# Patient Record
Sex: Female | Born: 1980
Health system: Southern US, Community
[De-identification: ages and names within clinical notes are randomized; demographics above are authoritative.]

## PROBLEM LIST (undated history)

## (undated) ENCOUNTER — Inpatient Hospital Stay (HOSPITAL_COMMUNITY): Admission: RE | Payer: Self-pay | Source: Ambulatory Visit

## (undated) DIAGNOSIS — K824 Cholesterolosis of gallbladder: Secondary | ICD-10-CM

## (undated) DIAGNOSIS — K219 Gastro-esophageal reflux disease without esophagitis: Secondary | ICD-10-CM

## (undated) DIAGNOSIS — R634 Abnormal weight loss: Secondary | ICD-10-CM

## (undated) DIAGNOSIS — F419 Anxiety disorder, unspecified: Secondary | ICD-10-CM

## (undated) DIAGNOSIS — Z87442 Personal history of urinary calculi: Secondary | ICD-10-CM

## (undated) DIAGNOSIS — N200 Calculus of kidney: Secondary | ICD-10-CM

## (undated) HISTORY — DX: Cholesterolosis of gallbladder: K82.4

## (undated) HISTORY — DX: Anxiety disorder, unspecified: F41.9

## (undated) HISTORY — PX: NO PAST SURGERIES: SHX2092

## (undated) HISTORY — DX: Gastro-esophageal reflux disease without esophagitis: K21.9

## (undated) HISTORY — DX: Abnormal weight loss: R63.4

---

## 2005-02-22 ENCOUNTER — Ambulatory Visit: Payer: Self-pay | Admitting: Family Medicine

## 2005-03-12 ENCOUNTER — Ambulatory Visit: Payer: Self-pay | Admitting: Family Medicine

## 2008-07-08 ENCOUNTER — Encounter: Admission: RE | Admit: 2008-07-08 | Discharge: 2008-07-08 | Payer: Self-pay | Admitting: Gastroenterology

## 2008-12-19 ENCOUNTER — Encounter: Admission: RE | Admit: 2008-12-19 | Discharge: 2008-12-19 | Payer: Self-pay | Admitting: Gastroenterology

## 2009-01-13 ENCOUNTER — Ambulatory Visit (HOSPITAL_COMMUNITY): Admission: RE | Admit: 2009-01-13 | Discharge: 2009-01-13 | Payer: Self-pay | Admitting: Gastroenterology

## 2009-11-24 ENCOUNTER — Encounter (INDEPENDENT_AMBULATORY_CARE_PROVIDER_SITE_OTHER): Payer: Self-pay | Admitting: *Deleted

## 2009-12-22 ENCOUNTER — Encounter (INDEPENDENT_AMBULATORY_CARE_PROVIDER_SITE_OTHER): Payer: Self-pay | Admitting: *Deleted

## 2009-12-22 ENCOUNTER — Ambulatory Visit: Payer: Self-pay | Admitting: Gastroenterology

## 2009-12-22 DIAGNOSIS — R110 Nausea: Secondary | ICD-10-CM | POA: Insufficient documentation

## 2009-12-26 LAB — CONVERTED CEMR LAB: Tissue Transglutaminase Ab, IgA: 0.6 units (ref <7)

## 2010-01-24 ENCOUNTER — Encounter (INDEPENDENT_AMBULATORY_CARE_PROVIDER_SITE_OTHER): Payer: Self-pay | Admitting: *Deleted

## 2010-01-24 ENCOUNTER — Ambulatory Visit: Payer: Self-pay | Admitting: Gastroenterology

## 2010-01-30 ENCOUNTER — Encounter: Payer: Self-pay | Admitting: Gastroenterology

## 2010-03-06 ENCOUNTER — Ambulatory Visit: Payer: Self-pay | Admitting: Gastroenterology

## 2010-04-12 ENCOUNTER — Telehealth: Payer: Self-pay | Admitting: Gastroenterology

## 2010-05-24 ENCOUNTER — Telehealth (INDEPENDENT_AMBULATORY_CARE_PROVIDER_SITE_OTHER): Payer: Self-pay | Admitting: *Deleted

## 2010-05-29 ENCOUNTER — Encounter (INDEPENDENT_AMBULATORY_CARE_PROVIDER_SITE_OTHER): Payer: Self-pay | Admitting: *Deleted

## 2010-10-23 NOTE — Miscellaneous (Signed)
Summary: rx  Clinical Lists Changes  Medications: Added new medication of NEXIUM 40 MG  CPDR (ESOMEPRAZOLE MAGNESIUM) 1 capsule each day 20-30 minutes before breakfast meal - Signed Rx of NEXIUM 40 MG  CPDR (ESOMEPRAZOLE MAGNESIUM) 1 capsule each day 20-30 minutes before breakfast meal;  #30 x 11;  Signed;  Entered by: Rachael Fee MD;  Authorized by: Rachael Fee MD;  Method used: Electronically to [facility] [phone]*, [address] [place], [place]  [address], Ph: [phone], Fax: [phone]    Prescriptions: NEXIUM 40 MG  CPDR (ESOMEPRAZOLE MAGNESIUM) 1 capsule each day 20-30 minutes before breakfast meal  #30 x 11   Entered and Authorized by:   Rachael Fee MD   Signed by:   Rachael Fee MD on 01/24/2010   Method used:   Electronically to        [facility] [phone]* (retail)       [address]       [place], [place]  [address]       Ph: [phone]       Fax: [phone]   RxID:   [id number]

## 2010-10-23 NOTE — Letter (Signed)
Summary: Appt Reminder 2  [facility]  [address] [place], [place] [address]   Phone: [phone]  Fax: [phone]        Jan 24, 2010 MRN: [id number]    [facility] [address] [place], [place]  [address]    Dear Ms. Rabe,   You have a return appointment with Dr. Christella Hartigan on 03/06/10 at 8:45am.  Please remember to bring a complete list of the medicines you are taking, your insurance card and your co-pay.  If you have to cancel or reschedule this appointment, please call before 5:00 pm the evening before to avoid a cancellation fee.  If you have any questions or concerns, please call [phone].    Sincerely,    Chales Abrahams CMA (AAMA)

## 2010-10-23 NOTE — Assessment & Plan Note (Signed)
History of Present Illness Visit Type: Initial Consult Primary GI MD: Rob Bunting MD Primary Provider: Tracey Harries, MD Chief Complaint: nausea History of Present Illness:     very pleasant 30 year old woman who is here with her husband today.  who has had nausea for at least a year.  Has seen Dr. Ewing Schlein and underwent several tests including CT scan, bloodwork,  HIDA scan, ultrasound.  EGD was recommended but she did not agree to that.  She has nausea intermittently. This past month has been pretty good ("best month in a year").  Usually the nausea is before she goes to bed.  VEry rare classic GERD symptoms. She did try nexium daily for one month and this diddn't help.  Recently she will have nausea 1-2 days in a week.  Previoulsy  She had tried lactose free diet, no improvement.  she takes phenergan as needed (1-2 pils at a time) about 2 times a week.  Pretty rare NSAIDs.  she brought with her records from her previous testing. A HIDA scan done April 2010 showed a gallbladder ejection fraction of 85%. Upper GI with small bowel follow-through was normal. That was on October 2009. March 2010 abdominal ultrasound was completely normal. She did have a CT scan at some point in 2009 suggested some mild terminal ileal thickening. Complete metabolic profile may 2010 was normal, TSH was normal, sedimentation rate was normal, TTG level, IgG related was normal. CBC was normal.            Current Medications (verified): 1)  Promethazine Hcl 25 Mg Tabs (Promethazine Hcl) .Marland Kitchen.. 1 By Mouth Three Times A Day 2)  Alprazolam 0.5 Mg Tabs (Alprazolam) .Marland Kitchen.. 1 By Mouth At Bedtime 3)  Ortho-Cept (28) 0.15-30 Mg-Mcg Tabs (Desogestrel-Ethinyl Estradiol) .Marland Kitchen.. 1 By Mouth Once Daily 4)  Ondansetron Hcl 4 Mg Tabs (Ondansetron Hcl) .... As Needed 5)  Allergy 4 Mg Tabs (Chlorpheniramine Maleate) .Marland Kitchen.. 1 By Mouth Once Daily  Allergies (verified): No Known Drug Allergies  Past History:  Past Medical  History: Anxiety Disorder   Past Surgical History: none  Family History: second degree family member with colon cancer  Social History: she is married, she does not have children, she quit smoking, she drinks one to 2 alcoholic beverages per week, she drinks about 2 caffeinated beverages per day  Review of Systems       Pertinent positive and negative review of systems were noted in the above HPI and GI specific review of systems.  All other review of systems was otherwise negative.   Vital Signs:  Patient profile:   31 year old female Height:      65 inches Weight:      142.25 pounds BMI:     23.76 Pulse rate:   68 / minute Pulse rhythm:   regular BP sitting:   118 / 68  (left arm)  Vitals Entered By: Milford Cage NCMA (December 22, 2009 11:12 AM)  Physical Exam  Additional Exam:  Constitutional: generally well appearing Psychiatric: alert and oriented times 3 Eyes: extraocular movements intact Mouth: oropharynx moist, no lesions Neck: supple, no lymphadenopathy Cardiovascular: heart regular rate and rythm Lungs: CTA bilaterally Abdomen: soft, non-tender, non-distended, no obvious ascites, no peritoneal signs, normal bowel sounds Extremities: no lower extremity edema bilaterally Skin: no lesions on visible extremities    Impression & Recommendations:  Problem # 1:  Nausea first, her symptoms of nausea have been improving this past month. She was put on a new  antianxiety medicine about 3 months ago perhaps that is helping. She was also put on any birth control pill 2-3 months ago perhaps that is having any good side effect of helping her nausea. Interestingly ago, her birth control pill #1 side effect is nausea. Perhaps this is GERD related, perhaps she has peptic ulcer disease. I think EGD is warranted and we'll proceed with at her soonest convenience. She did have celiac sprue testing with the tissue transglutaminase acid however was IgG related I think this should be  IgA instead. I will reorder celiac sprue testing.  Other Orders: TLB-IgA (Immunoglobulin A) ([id number]) EGD (EGD)  Patient Instructions: 1)  You will get lab test(s) done today (total IgA level). 2)  You will be scheduled to have an upper endoscopy. 3)  If endoscopy is normal, would proceed with gastric emptying scan. 4)  A copy of this information will be sent to Dr. Everlene Other. 5)  The medication list was reviewed and reconciled.  All changed / newly prescribed medications were explained.  A complete medication list was provided to the patient / caregiver.  Appended Document: TTG    Clinical Lists Changes  Orders: Added new Test order of T-Tissue Transglutamase Ab IgA [phone]) - Signed

## 2010-10-23 NOTE — Progress Notes (Signed)
Summary: repeat EGD  Phone Note Outgoing Call Call back at Home Phone [phone]   Call placed by: Chales Abrahams CMA Duncan Dull),  May 24, 2010 9:40 AM Summary of Call: pt needs repeat EGD left message on machine to call back  Initial call taken by: Chales Abrahams CMA Duncan Dull),  May 24, 2010 9:41 AM  Follow-up for Phone Call        left message on machine to call back letter mailed Follow-up by: Chales Abrahams CMA Duncan Dull),  May 29, 2010 10:15 AM

## 2010-10-23 NOTE — Progress Notes (Signed)
Summary: Medication Refill  Phone Note Call from Patient Call back at Home Phone [phone]   Caller: Patient Call For: Dr. Christella Hartigan Reason for Call: Refill Medication Summary of Call: needs a refill on her Promethazine Initial call taken by: Karna Christmas,  April 12, 2010 2:45 PM    Prescriptions: PROMETHAZINE HCL 25 MG TABS (PROMETHAZINE HCL) 1 by mouth three times a day  #30 x 2   Entered by:   Chales Abrahams CMA (AAMA)   Authorized by:   Rachael Fee MD   Signed by:   Chales Abrahams CMA (AAMA) on 04/12/2010   Method used:   Electronically to        [facility] [phone]* (retail)       [address]       [place], [place]  [address]       Ph: [phone]       Fax: [phone]   RxID:   [id number]

## 2010-10-23 NOTE — Letter (Signed)
Summary: EGD Instructions  [facility]  [address] [place], [place] [address]   Phone: [phone]  Fax: [phone]       Margaret Hayden    [birth year]    MRN: [id number]       Procedure Day /Date:01/24/10     Arrival Time: 800 am     Procedure Time:900 am     Location of Procedure:                    [facility] (4th Floor)   PREPARATION FOR ENDOSCOPY   On 01/24/10 THE DAY OF THE PROCEDURE:  1.   No solid foods, milk or milk products are allowed after midnight the night before your procedure.  2.   Do not drink anything colored red or purple.  Avoid juices with pulp.  No orange juice.  3.  You may drink clear liquids until 7 am, which is 2 hours before your procedure.                                                                                                CLEAR LIQUIDS INCLUDE: Water Jello Ice Popsicles Tea (sugar ok, no milk/cream) Powdered fruit flavored drinks Coffee (sugar ok, no milk/cream) Gatorade Juice: apple, white grape, white cranberry  Lemonade Clear bullion, consomm, broth Carbonated beverages (any kind) Strained chicken noodle soup Hard Candy   MEDICATION INSTRUCTIONS  Unless otherwise instructed, you should take regular prescription medications with a small sip of water as early as possible the morning of your procedure.             OTHER INSTRUCTIONS  You will need a responsible adult at least 28 years of age to accompany you and drive you home.   This person must remain in the waiting room during your procedure.  Wear loose fitting clothing that is easily removed.  Leave jewelry and other valuables at home.  However, you may wish to bring a book to read or an iPod/MP3 player to listen to music as you wait for your procedure to start.  Remove all body piercing jewelry and leave at home.  Total time from sign-in until discharge is approximately 2-3 hours.  You should go home directly after your  procedure and rest.  You can resume normal activities the day after your procedure.  The day of your procedure you should not:   Drive   Make legal decisions   Operate machinery   Drink alcohol   Return to work  You will receive specific instructions about eating, activities and medications before you leave.    The above instructions have been reviewed and explained to me by   _______________________    I fully understand and can verbalize these instructions _____________________________ Date _________

## 2010-10-23 NOTE — Procedures (Signed)
Summary: Upper Endoscopy  Patient: Margaret Hayden Note: All result statuses are Final unless otherwise noted.  Tests: (1) Upper Endoscopy (EGD)   EGD Upper Endoscopy       DONE     [facility]     [address]     [place], [place]  [address]           ENDOSCOPY PROCEDURE REPORT           PATIENT:  Margaret Hayden, Margaret Hayden  MR#:  [id number]     BIRTHDATE:  1981/03/29, 28 yrs. old  GENDER:  female     ENDOSCOPIST:  Rachael Fee, MD     Referred by:  Tracey Harries, M.D.     PROCEDURE DATE:  01/24/2010     PROCEDURE:  EGD with biopsy     ASA CLASS:  Class I     INDICATIONS:  nausea     MEDICATIONS:  Fentanyl 75 mcg IV, Versed 7 mg IV     TOPICAL ANESTHETIC:  Exactacain Spray           DESCRIPTION OF PROCEDURE:   After the risks benefits and     alternatives of the procedure were thoroughly explained, informed     consent was obtained.  The LB GIF-H180 [id number] endoscope was     introduced through the mouth and advanced to the second portion of     the duodenum, without limitations.  The instrument was slowly     withdrawn as the mucosa was fully examined.     <<PROCEDUREIMAGES>>     There was very mild, non-specific distal gastritis. This was     biopsied to check for H. pylori (pathology jar 1) (see image3).     There was an irregular Z-line which appeared to be an inflamed     proximal gastric fold (probably GERD related). Biopsies were taken     and sent to pathology (jar 2) (see image2 and image6).  Otherwise     the examination was normal (see image5, image4, and image1).     Retroflexed views revealed no abnormalities.    The scope was then     withdrawn from the patient and the procedure     completed.COMPLICATIONS:  None           ENDOSCOPIC IMPRESSION:     1) Mild, non-specific gastritis; biopsied to check for H. pylori           2) Irregular Z-line that is likely inflammed proximal aspect of     gastric fold (GERD related), biopsied     3) Otherwise normal  examination           RECOMMENDATIONS:     If biopsies show H. pylori then she will started on appropriate     antibiotics.     Should resume PPI daily (prescription for nexium called in, best     to take one pill 20-30 min prior to breakfast meal daily).     Dr. Christella Hartigan office will arrange for return office visit in 6-7     weeks.           ______________________________     Rachael Fee, MD           n.     eSIGNED:   Rachael Fee at 01/24/2010 09:02 AM           Margaret Hayden, [id number]  Note: An exclamation mark (!) indicates a result that was not  dispersed into the flowsheet. Document Creation Date: 01/24/2010 9:02 AM _______________________________________________________________________  (1) Order result status: Final Collection or observation date-time: 01/24/2010 08:54 Requested date-time:  Receipt date-time:  Reported date-time:  Referring Physician:   Ordering Physician: Rob Bunting [phone]) Specimen Source:  Source: Margaret Hayden Order Number: [id number] Lab site:

## 2010-10-23 NOTE — Letter (Signed)
Summary: Appointment Reminder  [facility]  [address] [place], [place] [address]   Phone: [phone]  Fax: [phone]        May 29, 2010 MRN: [id number]    [facility] [address] [place], [place]  [address]    Dear Margaret Hayden,   We have been unable to reach you by phone to schedule a follow up   procedure that was recommended for you by Dr. Christella Hartigan. It is very   important that we reach you to schedule an appointment. We hope that you  allow [place] to participate in your health care needs. Please contact us at  [phone] at your earliest convenience to schedule your appointment.     Sincerely,    Chales Abrahams CMA (AAMA)  Appended Document: Appointment Reminder letter mailed

## 2010-10-23 NOTE — Assessment & Plan Note (Signed)
Review of gastrointestinal problems: 1. Nausea:  (previously seeing Dr. Ewing Schlein...A HIDA scan done April 2010 showed a gallbladder ejection fraction of 85%. Upper GI with small bowel follow-through was normal. That was on October 2009. March 2010 abdominal ultrasound was completely normal. She did have a CT scan at some point in 2009 suggested some mild terminal ileal thickening. Complete metabolic profile may 2010 was normal, TSH was normal, sedimentation rate was normal, TTG level, IgG related was normal. CBC was normal.)  EGD may 2011 by Dr. Christella Hartigan found minimal, nonspecific gastritis. Biopsies showed no H. pylori however there was "extensive intestinal metaplasia."    History of Present Illness Visit Type: Follow-up Visit Primary GI MD: Rob Bunting MD Primary Provider: Tracey Harries, MD  Requesting Provider: na Chief Complaint: F/u visit  History of Present Illness:     Since the EGD about one month ago, has been on nexium every day.  Still with nausea intermittently (1-2 times a week), no vomiting.  She can get bloated at times.  She will take 1/2 pill of phenergan 1-2 a week.  Never takes zofran.           Current Medications (verified): 1)  Promethazine Hcl 25 Mg Tabs (Promethazine Hcl) .Marland Kitchen.. 1 By Mouth Three Times A Day 2)  Alprazolam 0.5 Mg Tabs (Alprazolam) .Marland Kitchen.. 1 By Mouth At Bedtime 3)  Ortho-Cept (28) 0.15-30 Mg-Mcg Tabs (Desogestrel-Ethinyl Estradiol) .Marland Kitchen.. 1 By Mouth Once Daily 4)  Ondansetron Hcl 4 Mg Tabs (Ondansetron Hcl) .... As Needed 5)  Allergy 4 Mg Tabs (Chlorpheniramine Maleate) .Marland Kitchen.. 1 By Mouth Once Daily 6)  Nexium 40 Mg  Cpdr (Esomeprazole Magnesium) .Marland Kitchen.. 1 Capsule Each Day 20-30 Minutes Before Breakfast Meal  Allergies (verified): No Known Drug Allergies  Vital Signs:  Patient profile:   30 year old female Height:      65 inches Weight:      140 pounds BMI:     23.38 BSA:     1.70 Pulse rate:   68 / minute Pulse rhythm:   regular BP sitting:   100 /  62  (left arm) Cuff size:   regular  Vitals Entered By: Ok Anis CMA (March 06, 2010 8:54 AM)  Physical Exam  Additional Exam:  Constitutional: generally well appearing Psychiatric: alert and oriented times 3 Abdomen: soft, non-tender, non-distended, normal bowel sounds    Impression & Recommendations:  Problem # 1:  Nausea #1 side effect of her birth control pill is nausea and vomiting. Her symptoms predate that pill however I suspect the pill could be contributing to some of her symptoms. Her nausea is clearly better since she has been on proton pump inhibitor on a daily basis. I recommended she try to come off of her birth control pill for one to 2 months to see if that would clear up any residual nausea. I also want to look again her stomach in 3 months time due to the "extensive intestinal metaplasia" noted on her EGD biopsies.  Patient Instructions: 1)  You will be scheduled to have an upper endoscopy in 3 months. 2)  Try coming off your birth control pill for 1-2 months prior to the EGD (obviously be very careful to use other BC methods). 3)  A copy of this information will be sent to Dr. Everlene Other. 4)  Stay on nexium once daily. 5)  The medication list was reviewed and reconciled.  All changed / newly prescribed medications were explained.  A complete medication list was provided  to the patient / caregiver.

## 2010-10-23 NOTE — Letter (Signed)
Summary: Results Letter  [facility]  [address] [place], [place] [address]   Phone: [phone]  Fax: [phone]        Jan 30, 2010 MRN: [id number]    [facility] [address] [place], [place]  [address]    Dear Ms. Schreur,   The biopsies taken during your recent upper endoscopy showed no sign of infection. Your stomach was clearly inflammed however.  You should continue to follow the recommnedations that we discussed at the time of your procedure, that is to resume the nexium once daily (a prescription was called in already) and to see me back in the office in 5-6 weeks to see how your symptoms are responding.  Please feel free to call if you have any further questions or concerns.       Sincerely,  Rachael Fee MD  This letter has been electronically signed by your physician.  Appended Document: Results Letter letter mailed 5.10.11

## 2010-10-23 NOTE — Letter (Signed)
Summary: New Patient letter  [facility]  [address] [place], [place] [address]   Phone: [phone]  Fax: [phone]       11/24/2009 MRN: [id number]  [facility] [address] [place], [place]  [address]  Dear Ms. Baglio,  Welcome to the Gastroenterology Division at [facility].    You are scheduled to see Dr.  Christella Hartigan on  12-22-09 at 11am on the 3rd floor at [facility], [address]  We ask that you try to arrive at our office 15 minutes prior to your appointment time to allow for check-in.  We would like you to complete the enclosed self-administered evaluation form prior to your visit and bring it with you on the day of your appointment.  We will review it with you.  Also, please bring a complete list of all your medications or, if you prefer, bring the medication bottles and we will list them.  Please bring your insurance card so that we may make a copy of it.  If your insurance requires a referral to see a specialist, please bring your referral form from your primary care physician.  Co-payments are due at the time of your visit and may be paid by cash, check or credit card.     Your office visit will consist of a consult with your physician (includes a physical exam), any laboratory testing he/she may order, scheduling of any necessary diagnostic testing (e.g. x-ray, ultrasound, CT-scan), and scheduling of a procedure (e.g. Endoscopy, Colonoscopy) if required.  Please allow enough time on your schedule to allow for any/all of these possibilities.    If you cannot keep your appointment, please call [phone] to cancel or reschedule prior to your appointment date.  This allows [place] the opportunity to schedule an appointment for another patient in need of care.  If you do not cancel or reschedule by 5 p.m. the business day prior to your appointment date, you will be charged a $50.00 late cancellation/no-show fee.    Thank you for choosing  [facility] for your medical needs.  We appreciate the opportunity to care for you.  Please visit [place] at our website  to learn more about our practice.                     Sincerely,                                                             The Gastroenterology Division

## 2011-09-25 ENCOUNTER — Other Ambulatory Visit: Payer: Self-pay | Admitting: Gastroenterology

## 2012-06-24 LAB — OB RESULTS CONSOLE HIV ANTIBODY (ROUTINE TESTING): HIV: NONREACTIVE

## 2012-06-24 LAB — OB RESULTS CONSOLE RPR: RPR: NONREACTIVE

## 2012-06-24 LAB — OB RESULTS CONSOLE ABO/RH: RH Type: POSITIVE

## 2012-06-24 LAB — OB RESULTS CONSOLE RUBELLA ANTIBODY, IGM: Rubella: NON-IMMUNE/NOT IMMUNE

## 2012-07-14 LAB — OB RESULTS CONSOLE GC/CHLAMYDIA: Gonorrhea: NEGATIVE

## 2012-09-23 NOTE — L&D Delivery Note (Signed)
[name] of VMI; APGARs 9,9.  EBL 250cc.  Weight pending. Head delivered ROA with body following atraumatically.  Mouth and nose bulb suctioned.  Cord clamped, cut, and baby to abdomen.  Placenta delivered S/I/3VC.  Fundus firmed with pitocin and massage.  Right periurethral and midline vaginal lacs repaired with 3-0 Rapide in the normal fashion.  Mom and baby stable.

## 2013-01-29 ENCOUNTER — Encounter (HOSPITAL_COMMUNITY): Payer: Self-pay | Admitting: *Deleted

## 2013-01-29 ENCOUNTER — Inpatient Hospital Stay (HOSPITAL_COMMUNITY)
Admission: AD | Admit: 2013-01-29 | Discharge: 2013-01-31 | DRG: 373 | Disposition: A | Discharge status: Home / Self Care | Payer: BC Managed Care – PPO | Source: Ambulatory Visit | Attending: Obstetrics & Gynecology | Admitting: Obstetrics & Gynecology

## 2013-01-29 ENCOUNTER — Inpatient Hospital Stay (HOSPITAL_COMMUNITY): Payer: BC Managed Care – PPO | Admitting: Anesthesiology

## 2013-01-29 ENCOUNTER — Encounter (HOSPITAL_COMMUNITY): Payer: Self-pay | Admitting: Anesthesiology

## 2013-01-29 HISTORY — DX: Calculus of kidney: N20.0

## 2013-01-29 LAB — CBC
HCT: 33 % — ABNORMAL LOW (ref 36.0–46.0)
MCHC: 34.5 g/dL (ref 30.0–36.0)
Platelets: 229 10*3/uL (ref 150–400)
RDW: 13.1 % (ref 11.5–15.5)

## 2013-01-29 MED ORDER — PRENATAL MULTIVITAMIN CH
1.0000 | ORAL_TABLET | Freq: Every day | ORAL | Status: DC
  Administered 2013-01-30 – 2013-01-31 (×2): 1 via ORAL
  Filled 2013-01-29 (×2): qty 1

## 2013-01-29 MED ORDER — PHENYLEPHRINE 40 MCG/ML (10ML) SYRINGE FOR IV PUSH (FOR BLOOD PRESSURE SUPPORT)
80.0000 ug | PREFILLED_SYRINGE | INTRAVENOUS | Status: DC | PRN
  Filled 2013-01-29: qty 5

## 2013-01-29 MED ORDER — DIBUCAINE 1 % RE OINT
1.0000 "application " | TOPICAL_OINTMENT | RECTAL | Status: DC | PRN
  Filled 2013-01-29: qty 28

## 2013-01-29 MED ORDER — IBUPROFEN 600 MG PO TABS: 600.0000 mg | ORAL_TABLET | Freq: Four times a day (QID) | ORAL | Status: DC | PRN

## 2013-01-29 MED ORDER — OXYTOCIN 40 UNITS IN LACTATED RINGERS INFUSION - SIMPLE MED
62.5000 mL/h | INTRAVENOUS | Status: DC
  Administered 2013-01-29: 62.5 mL/h via INTRAVENOUS
  Filled 2013-01-29: qty 1000

## 2013-01-29 MED ORDER — OXYCODONE-ACETAMINOPHEN 5-325 MG PO TABS: 1.0000 | ORAL_TABLET | ORAL | Status: DC | PRN

## 2013-01-29 MED ORDER — ACETAMINOPHEN 325 MG PO TABS: 650.0000 mg | ORAL_TABLET | ORAL | Status: DC | PRN

## 2013-01-29 MED ORDER — OXYCODONE-ACETAMINOPHEN 5-325 MG PO TABS
1.0000 | ORAL_TABLET | ORAL | Status: DC | PRN
  Administered 2013-01-30: 1 via ORAL
  Filled 2013-01-29: qty 2

## 2013-01-29 MED ORDER — BENZOCAINE-MENTHOL 20-0.5 % EX AERO
1.0000 "application " | INHALATION_SPRAY | CUTANEOUS | Status: DC | PRN
  Administered 2013-01-30: 1 via TOPICAL
  Filled 2013-01-29 (×2): qty 56

## 2013-01-29 MED ORDER — DIPHENHYDRAMINE HCL 25 MG PO CAPS: 25.0000 mg | ORAL_CAPSULE | Freq: Four times a day (QID) | ORAL | Status: DC | PRN

## 2013-01-29 MED ORDER — EPHEDRINE 5 MG/ML INJ
10.0000 mg | INTRAVENOUS | Status: DC | PRN
  Filled 2013-01-29: qty 4

## 2013-01-29 MED ORDER — DIPHENHYDRAMINE HCL 50 MG/ML IJ SOLN: 12.5000 mg | INTRAMUSCULAR | Status: DC | PRN

## 2013-01-29 MED ORDER — SENNOSIDES-DOCUSATE SODIUM 8.6-50 MG PO TABS
2.0000 | ORAL_TABLET | Freq: Every day | ORAL | Status: DC
  Administered 2013-01-30: 2 via ORAL

## 2013-01-29 MED ORDER — TERBUTALINE SULFATE 1 MG/ML IJ SOLN: 0.2500 mg | Freq: Once | INTRAMUSCULAR | Status: DC | PRN

## 2013-01-29 MED ORDER — LIDOCAINE HCL (PF) 1 % IJ SOLN
INTRAMUSCULAR | Status: DC | PRN
  Administered 2013-01-29 (×2): 5 mL

## 2013-01-29 MED ORDER — ZOLPIDEM TARTRATE 5 MG PO TABS: 5.0000 mg | ORAL_TABLET | Freq: Every evening | ORAL | Status: DC | PRN

## 2013-01-29 MED ORDER — LIDOCAINE HCL (PF) 1 % IJ SOLN
30.0000 mL | INTRAMUSCULAR | Status: DC | PRN
  Filled 2013-01-29: qty 30

## 2013-01-29 MED ORDER — LACTATED RINGERS IV SOLN
500.0000 mL | Freq: Once | INTRAVENOUS | Status: AC
Start: 2013-01-29 — End: 2013-01-29
  Administered 2013-01-29: 500 mL via INTRAVENOUS

## 2013-01-29 MED ORDER — ONDANSETRON HCL 4 MG/2ML IJ SOLN: 4.0000 mg | Freq: Four times a day (QID) | INTRAMUSCULAR | Status: DC | PRN

## 2013-01-29 MED ORDER — LACTATED RINGERS IV SOLN
500.0000 mL | INTRAVENOUS | Status: DC | PRN
  Administered 2013-01-29: 500 mL via INTRAVENOUS

## 2013-01-29 MED ORDER — ONDANSETRON HCL 4 MG PO TABS: 4.0000 mg | ORAL_TABLET | ORAL | Status: DC | PRN

## 2013-01-29 MED ORDER — SIMETHICONE 80 MG PO CHEW: 80.0000 mg | CHEWABLE_TABLET | ORAL | Status: DC | PRN

## 2013-01-29 MED ORDER — IBUPROFEN 600 MG PO TABS
600.0000 mg | ORAL_TABLET | Freq: Four times a day (QID) | ORAL | Status: DC
  Administered 2013-01-30 – 2013-01-31 (×8): 600 mg via ORAL
  Filled 2013-01-29 (×8): qty 1

## 2013-01-29 MED ORDER — LANOLIN HYDROUS EX OINT: TOPICAL_OINTMENT | CUTANEOUS | Status: DC | PRN

## 2013-01-29 MED ORDER — OXYTOCIN 40 UNITS IN LACTATED RINGERS INFUSION - SIMPLE MED
1.0000 m[IU]/min | INTRAVENOUS | Status: DC
  Administered 2013-01-29: 2 m[IU]/min via INTRAVENOUS

## 2013-01-29 MED ORDER — ONDANSETRON HCL 4 MG/2ML IJ SOLN: 4.0000 mg | INTRAMUSCULAR | Status: DC | PRN

## 2013-01-29 MED ORDER — FLEET ENEMA 7-19 GM/118ML RE ENEM: 1.0000 | ENEMA | RECTAL | Status: DC | PRN

## 2013-01-29 MED ORDER — WITCH HAZEL-GLYCERIN EX PADS: 1.0000 "application " | MEDICATED_PAD | CUTANEOUS | Status: DC | PRN

## 2013-01-29 MED ORDER — OXYTOCIN BOLUS FROM INFUSION: 500.0000 mL | INTRAVENOUS | Status: DC

## 2013-01-29 MED ORDER — FENTANYL 2.5 MCG/ML BUPIVACAINE 1/10 % EPIDURAL INFUSION (WH - ANES)
14.0000 mL/h | INTRAMUSCULAR | Status: DC | PRN
  Administered 2013-01-29 (×2): 14 mL/h via EPIDURAL
  Filled 2013-01-29 (×2): qty 125

## 2013-01-29 MED ORDER — TETANUS-DIPHTH-ACELL PERTUSSIS 5-2.5-18.5 LF-MCG/0.5 IM SUSP
0.5000 mL | Freq: Once | INTRAMUSCULAR | Status: DC
  Filled 2013-01-29: qty 0.5

## 2013-01-29 MED ORDER — LACTATED RINGERS IV SOLN
INTRAVENOUS | Status: DC
  Administered 2013-01-29 (×2): 125 mL via INTRAVENOUS
  Administered 2013-01-29: 20:00:00 via INTRAVENOUS

## 2013-01-29 MED ORDER — CITRIC ACID-SODIUM CITRATE 334-500 MG/5ML PO SOLN: 30.0000 mL | ORAL | Status: DC | PRN

## 2013-01-29 MED ORDER — EPHEDRINE 5 MG/ML INJ: 10.0000 mg | INTRAVENOUS | Status: DC | PRN

## 2013-01-29 MED ORDER — PHENYLEPHRINE 40 MCG/ML (10ML) SYRINGE FOR IV PUSH (FOR BLOOD PRESSURE SUPPORT): 80.0000 ug | PREFILLED_SYRINGE | INTRAVENOUS | Status: DC | PRN

## 2013-01-29 NOTE — Anesthesia Procedure Notes (Signed)
Epidural Patient location during procedure: OB Start time: 01/29/2013 10:15 AM  Staffing Anesthesiologist: Angus Seller., Harrell Gave. Performed by: anesthesiologist   Preanesthetic Checklist Completed: patient identified, site marked, surgical consent, pre-op evaluation, timeout performed, IV checked, risks and benefits discussed and monitors and equipment checked  Epidural Patient position: sitting Prep: site prepped and draped and DuraPrep Patient monitoring: continuous pulse ox and blood pressure Approach: midline Injection technique: LOR air and LOR saline  Needle:  Needle type: Tuohy  Needle gauge: 17 G Needle length: 9 cm and 9 Needle insertion depth: 6 cm Catheter type: closed end flexible Catheter size: 19 Gauge Catheter at skin depth: 12 cm Test dose: negative  Assessment Events: blood not aspirated, injection not painful, no injection resistance, negative IV test and no paresthesia  Additional Notes Patient identified.  Risk benefits discussed including failed block, incomplete pain control, headache, nerve damage, paralysis, blood pressure changes, nausea, vomiting, reactions to medication both toxic or allergic, and postpartum back pain.  Patient expressed understanding and wished to proceed.  All questions were answered.  Sterile technique used throughout procedure and epidural site dressed with sterile barrier dressing. No paresthesia or other complications noted.The patient did not experience any signs of intravascular injection such as tinnitus or metallic taste in mouth nor signs of intrathecal spread such as rapid motor block. Please see nursing notes for vital signs.

## 2013-01-29 NOTE — MAU Note (Signed)
Contractions started yesterday morning, now every 5 min. No bleeding leaking.

## 2013-01-29 NOTE — H&P (Signed)
Margaret Hayden is a 32 y.o. female presenting for labor; CTX started at 8 am yesterday.  +FM.  No VB, LOF.  GBS negative.  No antepartum complications.   Maternal Medical History:  Reason for admission: Contractions.   Contractions: Onset was 13-24 hours ago.   Frequency: regular.   Perceived severity is moderate.    Fetal activity: Perceived fetal activity is normal.   Last perceived fetal movement was within the past hour.    Prenatal complications: no prenatal complications Prenatal Complications - Diabetes: none.    OB History   Grav Para Term Preterm Abortions TAB SAB Ect Mult Living   1              Past Medical History  Diagnosis Date  . Kidney stone    Past Surgical History  Procedure Laterality Date  . No past surgeries     Family History: family history is not on file. Social History:  reports that she has never smoked. She does not have any smokeless tobacco history on file. She reports that she does not drink alcohol or use illicit drugs.   Prenatal Transfer Tool  Maternal Diabetes: No Genetic Screening: Normal Maternal Ultrasounds/Referrals: Normal Fetal Ultrasounds or other Referrals:  None Maternal Substance Abuse:  No Significant Maternal Medications:  None Significant Maternal Lab Results:  None Other Comments:  None  ROS  Dilation: 3 Effacement (%): 80 Station: -2 Exam by:: Dorrene German RN Blood pressure 139/79, pulse 96, temperature 98.2 F (36.8 C), temperature source Oral, resp. rate 20, height 5\' 4"  (1.626 m), weight 177 lb (80.287 kg). Maternal Exam:  Uterine Assessment: Contraction strength is moderate.  Contraction frequency is regular.   Abdomen: Patient reports no abdominal tenderness. Fundal height is c/w dates.   Estimated fetal weight is 7#6.   Fetal presentation: vertex     Physical Exam  Constitutional: She is oriented to person, place, and time. She appears well-developed and well-nourished.  GI: Soft. Bowel sounds are  normal. There is no rebound and no guarding.  Neurological: She is alert and oriented to person, place, and time.  Skin: Skin is warm and dry.  Psychiatric: She has a normal mood and affect. Her behavior is normal.    Prenatal labs: ABO, Rh: A/Positive/-- (10/02 0000) Antibody: Negative (10/02 0000) Rubella: Nonimmune (10/02 0000) RPR: Nonreactive (10/02 0000)  HBsAg: Negative (10/02 0000)  HIV: Non-reactive (10/02 0000)  GBS: Negative (04/15 0000)   Assessment/Plan: 31yo G1 at [redacted]w[redacted]d with labor -GBS neg -Epidural when desired -Augment as needed   Keeva Reisen 01/29/2013, 10:04 AM

## 2013-01-29 NOTE — Anesthesia Preprocedure Evaluation (Signed)

## 2013-01-29 NOTE — Progress Notes (Signed)
Comfortable with epidural  VSS  FHT: Cat I SVE: 5/c/-1, AROM, lt mec Toco: q 3 min  31yo G1 at [redacted]w[redacted]d with labor -Anticipate [name] -Augment as needed

## 2013-01-30 LAB — CBC
Hemoglobin: 9.8 g/dL — ABNORMAL LOW (ref 12.0–15.0)
MCH: 32 pg (ref 26.0–34.0)
MCHC: 34.1 g/dL (ref 30.0–36.0)
RDW: 13.2 % (ref 11.5–15.5)

## 2013-01-30 NOTE — Anesthesia Postprocedure Evaluation (Signed)
  Anesthesia Post-op Note  Patient: Margaret Hayden  Procedure(s) Performed: * No procedures listed *  Patient Location: Mother/Baby  Anesthesia Type:Epidural  Level of Consciousness: awake, alert  and oriented  Airway and Oxygen Therapy: Patient Spontanous Breathing  Post-op Pain: none  Post-op Assessment: Post-op Vital signs reviewed, Pain level controlled, No headache, No backache, No residual numbness and No residual motor weakness  Post-op Vital Signs: Reviewed and stable  Complications: No apparent anesthesia complications

## 2013-01-30 NOTE — Anesthesia Postprocedure Evaluation (Deleted)
  Anesthesia Post-op Note  Patient: Margaret Hayden  Procedure(s) Performed: * No procedures listed *  Patient Location: Mother/Baby  Anesthesia Type:Epidural  Level of Consciousness: awake, alert  and oriented  Airway and Oxygen Therapy: Patient Spontanous Breathing  Post-op Pain: none  Post-op Assessment: Post-op Vital signs reviewed, Pain level not controlled, No headache, No backache, No residual numbness and No residual motor weakness  Post-op Vital Signs: Reviewed and stable  Complications: No apparent anesthesia complications

## 2013-01-30 NOTE — Progress Notes (Signed)
Patient was referred for history of depression/anxiety. * Referral screened out by Clinical Social Worker because none of the following criteria appear to apply:  ~ History of anxiety/depression during this pregnancy, or of post-partum depression.  ~ Diagnosis of anxiety and/or depression within last 5 years, as per chart review.  ~ History of depression due to pregnancy loss/loss of child  OR * Patient's symptoms currently being treated with medication and/or therapy.  Please contact the Clinical Social Worker if needs arise, or by the patient's request.

## 2013-01-30 NOTE — Progress Notes (Signed)
Post Partum Day 1 Subjective: no complaints, up ad lib, voiding and tolerating PO  Objective: Blood pressure 125/70, pulse 92, temperature 98.4 F (36.9 C), temperature source Oral, resp. rate 20, height 5\' 4"  (1.626 m), weight 177 lb (80.287 kg), SpO2 99.00%, unknown if currently breastfeeding.  Physical Exam:  General: alert, cooperative and appears stated age Lochia: appropriate Uterine Fundus: firm Incision: n/a DVT Evaluation: No evidence of DVT seen on physical exam. Negative Homan's sign. No cords or calf tenderness.   Recent Labs  01/29/13 0920 01/30/13 0610  HGB 11.4* 9.8*  HCT 33.0* 28.7*    Assessment/Plan: Plan for discharge tomorrow, Breastfeeding and Circumcision prior to discharge   LOS: 1 day   Margaret Hayden 01/30/2013, 11:16 AM

## 2013-01-31 MED ORDER — OXYCODONE-ACETAMINOPHEN 5-325 MG PO TABS: 1.0000 | ORAL_TABLET | ORAL | Status: DC | PRN

## 2013-01-31 MED ORDER — IBUPROFEN 600 MG PO TABS: 600.0000 mg | ORAL_TABLET | Freq: Four times a day (QID) | ORAL | Status: DC

## 2013-01-31 NOTE — Progress Notes (Signed)
Post Partum Day 2 Subjective: no complaints, up ad lib, voiding and tolerating PO.  Requests circ.  Objective: Blood pressure 124/69, pulse 93, temperature 97.9 F (36.6 C), temperature source Oral, resp. rate 18, height 5\' 4"  (1.626 m), weight 177 lb (80.287 kg), SpO2 100.00%, unknown if currently breastfeeding.  Physical Exam:  General: alert, cooperative and appears stated age Lochia: appropriate Uterine Fundus: firm Incision: n/a  DVT Evaluation: No evidence of DVT seen on physical exam. Negative Homan's sign. No cords or calf tenderness.   Recent Labs  01/29/13 0920 01/30/13 0610  HGB 11.4* 9.8*  HCT 33.0* 28.7*    Assessment/Plan: Discharge home, Breastfeeding and Circumcision prior to discharge Counseled re: r/b/a of circ.   LOS: 2 days   Margaret Hayden 01/31/2013, 9:30 AM

## 2013-01-31 NOTE — Discharge Summary (Signed)
Obstetric Discharge Summary Reason for Admission: onset of labor Prenatal Procedures: none Intrapartum Procedures: spontaneous vaginal delivery Postpartum Procedures: none Complications-Operative and Postpartum: none Hemoglobin  Date Value Range Status  01/30/2013 9.8* 12.0 - 15.0 g/dL Final     HCT  Date Value Range Status  01/30/2013 28.7* 36.0 - 46.0 % Final    Physical Exam:  General: alert, cooperative and appears stated age Lochia: appropriate Uterine Fundus: firm Incision: n/a DVT Evaluation: No evidence of DVT seen on physical exam. Negative Homan's sign. No cords or calf tenderness.  Discharge Diagnoses: Term Pregnancy-delivered  Discharge Information: Date: 01/31/2013 Activity: pelvic rest Diet: routine Medications: PNV, Ibuprofen and Percocet Condition: stable Instructions: refer to practice specific booklet Discharge to: home   Newborn Data: Live born female  Birth Weight: 8 lb 4 oz (3742 g) APGAR: 9, 9  Home with mother.  Margaret Hayden 01/31/2013, 11:04 AM

## 2013-02-01 NOTE — Progress Notes (Signed)
Post discharge chart review completed.  

## 2013-02-04 ENCOUNTER — Ambulatory Visit (HOSPITAL_COMMUNITY)
Admit: 2013-02-04 | Discharge: 2013-02-04 | Disposition: A | Discharge status: Home / Self Care | Payer: BC Managed Care – PPO | Attending: Obstetrics & Gynecology | Admitting: Obstetrics & Gynecology

## 2013-02-04 NOTE — Lactation Note (Signed)
Adult Lactation Consultation Outpatient Visit Note  Patient Name: Margaret Hayden(mother)  BABY:  Margaret Hayden Date of Birth: 1980/10/11                                DOB:  [birth year] Gestational Age at Delivery: [redacted]w[redacted]d                 BIRTH WEIGHT: 8-4 Type of Delivery: NVD                                      DISCHARGE WEIGHT: 7-10                                                                           WEIGHT TODAY: 7-14.2 Breastfeeding History: Frequency of Breastfeeding: PUMPING AND BOTTLE FEEDING DURING DAY; BREAST FEEDS WITH 24 MM NIPPLE SHIELD AT NIGHT Length of Feeding:  Voids: qs Stools: qs  Supplementing / Method:BOTTLE EBM 2 OZ EVERY 2.5- 3 HOURS Pumping:  Type of Pump:MEDELA SINGLE JUST RECEIVED DEBP   Frequency:  Volume:    Comments:    Consultation Evaluation:Mom states she chooses to pump and bottle feed during day so she can measure amount baby is getting.  She is happy with her plan.  Discussed how to increase milk supply if needed when pumping and bottle feeding.  Encouraged to call [facility] office with questions/concerns.  Initial Feeding Assessment:FEEDING ASSESSMENT NOT NEEDED Pre-feed Weight: Post-feed Weight: Amount Transferred: Comments:  Additional Feeding Assessment: Pre-feed Weight: Post-feed Weight: Amount Transferred: Comments:  Additional Feeding Assessment: Pre-feed Weight: Post-feed Weight: Amount Transferred: Comments:  Total Breast milk Transferred this Visit: N/A Total Supplement Given:   Additional Interventions:   Follow-Up WILL CALL [facility] OFFICE PRN      Margaret Hayden 02/04/2013, 4:36 PM

## 2013-11-12 ENCOUNTER — Ambulatory Visit (INDEPENDENT_AMBULATORY_CARE_PROVIDER_SITE_OTHER): Payer: BC Managed Care – PPO | Admitting: Family Medicine

## 2013-11-12 VITALS — BP 130/78 | HR 89 | Temp 98.0°F | Resp 16 | Ht 64.5 in | Wt 168.0 lb

## 2013-11-12 DIAGNOSIS — R05 Cough: Secondary | ICD-10-CM

## 2013-11-12 DIAGNOSIS — J069 Acute upper respiratory infection, unspecified: Secondary | ICD-10-CM

## 2013-11-12 DIAGNOSIS — R059 Cough, unspecified: Secondary | ICD-10-CM

## 2013-11-12 DIAGNOSIS — R6889 Other general symptoms and signs: Secondary | ICD-10-CM

## 2013-11-12 MED ORDER — HYDROCODONE-HOMATROPINE 5-1.5 MG/5ML PO SYRP: 5.0000 mL | ORAL_SOLUTION | ORAL | Status: DC | PRN

## 2013-11-12 MED ORDER — IPRATROPIUM BROMIDE 0.03 % NA SOLN: 2.0000 | Freq: Four times a day (QID) | NASAL | Status: DC

## 2013-11-12 MED ORDER — BENZONATATE 100 MG PO CAPS: 100.0000 mg | ORAL_CAPSULE | Freq: Three times a day (TID) | ORAL | Status: DC | PRN

## 2013-11-12 NOTE — Progress Notes (Signed)
Subjective: 33 year old lady here with a respiratory tract infection for the past week. She never had much in the way of fever or aches, but started with a coughing progressed to a bad cough. She's had some upper respiratory congestion, especially last few days. She is felt bad the whole time. Generally she is a healthy lady. Part-time works as a Leisure centre managerbartender. [place]  Objective: Alert and oriented. Constant cough. TMs normal. Throat clear. Neck supple without nodes. Chest clear. Heart regular without murmurs.  Assessment: Viral syndrome, flulike, with a cough  Plan: Hycodan, Tessalon, and Atrovent nasal

## 2013-11-12 NOTE — Patient Instructions (Signed)
Drink plenty of fluids  Use the hydrocodone cough syrup 1 teaspoon every 4-6 hours as needed for cough. It will make you drowsy  Take the Tessalon one to 2 pills 3 times daily as needed for cough. This is not as strong but is not sedating.   Take the Atrovent nose spray 2 sprays each nostril 4 times daily as needed for nasal congestion  Consider taking something like Claritin-D or Allegra-D in addition to the above. These can be purchased over-the-counter.   If worse than transitioning to coughing up a lot purulent phlegm then we would consider the possibility of a secondary bacterial infection and placed her on an antibiotic.

## 2014-03-15 LAB — OB RESULTS CONSOLE HIV ANTIBODY (ROUTINE TESTING): HIV: NONREACTIVE

## 2014-03-15 LAB — OB RESULTS CONSOLE GC/CHLAMYDIA
CHLAMYDIA, DNA PROBE: NEGATIVE
GC PROBE AMP, GENITAL: NEGATIVE

## 2014-03-15 LAB — OB RESULTS CONSOLE RPR: RPR: NONREACTIVE

## 2014-03-15 LAB — OB RESULTS CONSOLE ABO/RH: RH Type: POSITIVE

## 2014-03-15 LAB — OB RESULTS CONSOLE RUBELLA ANTIBODY, IGM: RUBELLA: NON-IMMUNE/NOT IMMUNE

## 2014-03-15 LAB — OB RESULTS CONSOLE ANTIBODY SCREEN: Antibody Screen: NEGATIVE

## 2014-03-15 LAB — OB RESULTS CONSOLE HEPATITIS B SURFACE ANTIGEN: Hepatitis B Surface Ag: NEGATIVE

## 2014-04-20 ENCOUNTER — Inpatient Hospital Stay (HOSPITAL_COMMUNITY)
Admission: AD | Admit: 2014-04-20 | Payer: BC Managed Care – PPO | Source: Ambulatory Visit | Admitting: Obstetrics and Gynecology

## 2014-09-21 ENCOUNTER — Inpatient Hospital Stay (HOSPITAL_COMMUNITY)
Admission: AD | Admit: 2014-09-21 | Discharge: 2014-09-21 | Disposition: A | Discharge status: Home / Self Care | Payer: BC Managed Care – PPO | Source: Ambulatory Visit | Attending: Obstetrics & Gynecology | Admitting: Obstetrics & Gynecology

## 2014-09-21 ENCOUNTER — Encounter (HOSPITAL_COMMUNITY): Payer: Self-pay

## 2014-09-21 DIAGNOSIS — Z3A35 35 weeks gestation of pregnancy: Secondary | ICD-10-CM | POA: Diagnosis not present

## 2014-09-21 DIAGNOSIS — O212 Late vomiting of pregnancy: Secondary | ICD-10-CM | POA: Diagnosis not present

## 2014-09-21 DIAGNOSIS — R112 Nausea with vomiting, unspecified: Secondary | ICD-10-CM

## 2014-09-21 LAB — URINALYSIS, ROUTINE W REFLEX MICROSCOPIC
BILIRUBIN URINE: NEGATIVE
Glucose, UA: NEGATIVE mg/dL
HGB URINE DIPSTICK: NEGATIVE
Ketones, ur: >80 mg/dL — AB
NITRITE: NEGATIVE
PH: 6.5 (ref 5.0–8.0)
Protein, ur: NEGATIVE mg/dL
SPECIFIC GRAVITY, URINE: 1.025 (ref 1.005–1.030)
Urobilinogen, UA: 0.2 mg/dL (ref 0.0–1.0)

## 2014-09-21 LAB — URINE MICROSCOPIC-ADD ON

## 2014-09-21 MED ORDER — DEXTROSE 5 % IN LACTATED RINGERS IV BOLUS
1000.0000 mL | Freq: Once | INTRAVENOUS | Status: AC
  Administered 2014-09-21: 1000 mL via INTRAVENOUS

## 2014-09-21 MED ORDER — LACTATED RINGERS IV SOLN
25.0000 mg | Freq: Once | INTRAVENOUS | Status: AC
  Administered 2014-09-21: 25 mg via INTRAVENOUS
  Filled 2014-09-21: qty 1

## 2014-09-21 NOTE — MAU Note (Signed)
Patient states she started having nausea, vomiting and diarrhea with abdominal cramping last night. Having mild contractions, no leaking or bleeding and reports good fetal movement.

## 2014-09-21 NOTE — Progress Notes (Signed)
Up to BR to void, tolerated well. Ready for D/C. No vomiting or diarrhea.

## 2014-09-21 NOTE — MAU Provider Note (Signed)
History     CSN: [id number]  Arrival date and time: 09/21/14 1037   First Provider Initiated Contact with Patient 09/21/14 1228      Chief Complaint  Patient presents with  . Nausea  . Emesis  . Diarrhea  . Abdominal Cramping   HPI  Pt is a 33 yo G2P1001 at 482w4d weeks IUP here with report of nausea, vomiting and diarrhea with abdominal cramping that started last night. Reports intermittent mild contractions.  Denies leaking of fluid or vaginal bleeding.  Reports good fetal movement  Past Medical History  Diagnosis Date  . Kidney stone     Past Surgical History  Procedure Laterality Date  . No past surgeries      No family history on file.  History  Substance Use Topics  . Smoking status: Never Smoker   . Smokeless tobacco: Not on file  . Alcohol Use: No    Allergies: No Known Allergies  Prescriptions prior to admission  Medication Sig Dispense Refill Last Dose  . calcium carbonate (TUMS - DOSED IN MG ELEMENTAL CALCIUM) 500 MG chewable tablet Chew 2 tablets by mouth 2 (two) times daily as needed for heartburn.   09/20/2014 at Unknown time  . omeprazole (PRILOSEC OTC) 20 MG tablet Take 20 mg by mouth 2 (two) times daily as needed (heartburn).   Past Week at Unknown time  . Prenatal Vit-Fe Fumarate-FA (PRENATAL MULTIVITAMIN) TABS Take 1 tablet by mouth at bedtime.   Past Week at Unknown time  . promethazine (PHENERGAN) 25 MG tablet Take 25 mg by mouth every 6 (six) hours as needed for nausea.   09/20/2014 at Unknown time  . benzonatate (TESSALON) 100 MG capsule Take 1-2 capsules (100-200 mg total) by mouth 3 (three) times daily as needed for cough. (Patient not taking: Reported on 09/21/2014) 30 capsule 0   . HYDROcodone-homatropine (HYCODAN) 5-1.5 MG/5ML syrup Take 5 mLs by mouth every 4 (four) hours as needed for cough. (Patient not taking: Reported on 09/21/2014) 120 mL 0   . ibuprofen (ADVIL,MOTRIN) 600 MG tablet Take 1 tablet (600 mg total) by mouth every 6 (six)  hours. (Patient not taking: Reported on 09/21/2014) 30 tablet 1 Taking  . ipratropium (ATROVENT) 0.03 % nasal spray Place 2 sprays into the nose 4 (four) times daily. (Patient not taking: Reported on 09/21/2014) 30 mL 0   . oxyCODONE-acetaminophen (PERCOCET/ROXICET) 5-325 MG per tablet Take 1-2 tablets by mouth every 4 (four) hours as needed. (Patient not taking: Reported on 09/21/2014) 30 tablet 0 Not Taking    Review of Systems  Constitutional: Positive for chills. Negative for fever.  Gastrointestinal: Positive for nausea, vomiting, abdominal pain (intermittent contractions) and diarrhea. Negative for constipation.  All other systems reviewed and are negative.  Physical Exam   Blood pressure 95/60, pulse 85, temperature 98.1 F (36.7 C), temperature source Oral, resp. rate 16, height 5\' 5"  (1.651 m), weight 77.474 kg (170 lb 12.8 oz), last menstrual period 10/16/2013, SpO2 100 %, unknown if currently breastfeeding.  Physical Exam  Constitutional: She is oriented to person, place, and time. She appears well-developed and well-nourished. No distress.  HENT:  Head: Normocephalic.  Mouth/Throat: Mucous membranes are dry.  Neck: Normal range of motion. Neck supple.  Cardiovascular: Normal rate, regular rhythm and normal heart sounds.   Respiratory: Effort normal and breath sounds normal.  GI: Soft. There is no tenderness.  Genitourinary: No bleeding in the vagina. Vaginal discharge (mucusy) found.  Neurological: She is alert and oriented to  person, place, and time.  Skin: Skin is warm and dry.   Dilation: Closed Effacement (%): Thick Cervical Position: Posterior Exam by:: Margarita MailW. Karim, CNM   MAU Course  Procedures  Results for orders placed or performed during the hospital encounter of 09/21/14 (from the past 24 hour(s))  Urinalysis, Routine w reflex microscopic     Status: Abnormal   Collection Time: 09/21/14 11:48 AM  Result Value Ref Range   Color, Urine YELLOW YELLOW    APPearance HAZY (A) CLEAR   Specific Gravity, Urine 1.025 1.005 - 1.030   pH 6.5 5.0 - 8.0   Glucose, UA NEGATIVE NEGATIVE mg/dL   Hgb urine dipstick NEGATIVE NEGATIVE   Bilirubin Urine NEGATIVE NEGATIVE   Ketones, ur >80 (A) NEGATIVE mg/dL   Protein, ur NEGATIVE NEGATIVE mg/dL   Urobilinogen, UA 0.2 0.0 - 1.0 mg/dL   Nitrite NEGATIVE NEGATIVE   Leukocytes, UA TRACE (A) NEGATIVE  Urine microscopic-add on     Status: Abnormal   Collection Time: 09/21/14 11:48 AM  Result Value Ref Range   Squamous Epithelial / LPF FEW (A) RARE   WBC, UA 0-2 <3 WBC/hpf   RBC / HPF 0-2 <3 RBC/hpf   Bacteria, UA FEW (A) RARE   Urine-Other MUCOUS PRESENT      Assessment and Plan  33 yo G2P1001 at 5364w4d wks IUP Nausea and Vomiting in Pregnancy Reactive NST  Plan: Discharge to home Increase fluids Follow-up if worsening or no improvement in symptoms.   Eino FarberWalidah Kennith GainN Karim, CNM

## 2014-09-21 NOTE — MAU Note (Signed)
2000 diarrhea x 10 stools and vomiting since 0100, office sent

## 2014-09-23 NOTE — L&D Delivery Note (Signed)
Delivery Note  [name] viable female Apgars 9,9 over intact perineum but small Bil periurethral lac not needed repair.  Placenta delivered spontaneously intact with 3VC. Good support and hemostasis noted and R/V exam confirms.  PH art was sent.  [name] cord blood was not done.  Mother and baby were doing well.  EBL 200cc  Margaret Campavid Lesleyanne Politte, MD

## 2014-10-05 ENCOUNTER — Inpatient Hospital Stay (HOSPITAL_COMMUNITY): Payer: BLUE CROSS/BLUE SHIELD | Admitting: Anesthesiology

## 2014-10-05 ENCOUNTER — Encounter (HOSPITAL_COMMUNITY): Payer: Self-pay | Admitting: *Deleted

## 2014-10-05 ENCOUNTER — Encounter (HOSPITAL_COMMUNITY): Payer: Self-pay

## 2014-10-05 ENCOUNTER — Inpatient Hospital Stay (HOSPITAL_COMMUNITY)
Admission: AD | Admit: 2014-10-05 | Discharge: 2014-10-07 | DRG: 775 | Disposition: A | Discharge status: Home / Self Care | Payer: BLUE CROSS/BLUE SHIELD | Source: Ambulatory Visit | Attending: Obstetrics and Gynecology | Admitting: Obstetrics and Gynecology

## 2014-10-05 ENCOUNTER — Inpatient Hospital Stay (HOSPITAL_COMMUNITY)
Admission: EM | Admit: 2014-10-05 | Discharge: 2014-10-05 | Disposition: A | Discharge status: Home / Self Care | Payer: BLUE CROSS/BLUE SHIELD | Attending: Obstetrics and Gynecology | Admitting: Obstetrics and Gynecology

## 2014-10-05 DIAGNOSIS — Z3A37 37 weeks gestation of pregnancy: Secondary | ICD-10-CM | POA: Diagnosis present

## 2014-10-05 DIAGNOSIS — O471 False labor at or after 37 completed weeks of gestation: Secondary | ICD-10-CM | POA: Insufficient documentation

## 2014-10-05 LAB — CBC
HEMATOCRIT: 33.6 % — AB (ref 36.0–46.0)
Hemoglobin: 11.4 g/dL — ABNORMAL LOW (ref 12.0–15.0)
MCH: 31.8 pg (ref 26.0–34.0)
MCHC: 33.9 g/dL (ref 30.0–36.0)
MCV: 93.9 fL (ref 78.0–100.0)
Platelets: 247 10*3/uL (ref 150–400)
RBC: 3.58 MIL/uL — AB (ref 3.87–5.11)
RDW: 13.6 % (ref 11.5–15.5)
WBC: 16.8 10*3/uL — AB (ref 4.0–10.5)

## 2014-10-05 LAB — TYPE AND SCREEN
ABO/RH(D): A POS
ANTIBODY SCREEN: NEGATIVE

## 2014-10-05 MED ORDER — ACETAMINOPHEN 325 MG PO TABS: 650.0000 mg | ORAL_TABLET | ORAL | Status: DC | PRN

## 2014-10-05 MED ORDER — FLEET ENEMA 7-19 GM/118ML RE ENEM: 1.0000 | ENEMA | RECTAL | Status: DC | PRN

## 2014-10-05 MED ORDER — FENTANYL 2.5 MCG/ML BUPIVACAINE 1/10 % EPIDURAL INFUSION (WH - ANES)
INTRAMUSCULAR | Status: DC | PRN
  Administered 2014-10-05: 14 mL/h via EPIDURAL

## 2014-10-05 MED ORDER — FENTANYL 2.5 MCG/ML BUPIVACAINE 1/10 % EPIDURAL INFUSION (WH - ANES)
INTRAMUSCULAR | Status: AC
  Administered 2014-10-05: 14 mL/h via EPIDURAL
  Filled 2014-10-05: qty 125

## 2014-10-05 MED ORDER — LACTATED RINGERS IV SOLN: 500.0000 mL | INTRAVENOUS | Status: DC | PRN

## 2014-10-05 MED ORDER — EPHEDRINE 5 MG/ML INJ
10.0000 mg | INTRAVENOUS | Status: DC | PRN
  Filled 2014-10-05: qty 2

## 2014-10-05 MED ORDER — LIDOCAINE HCL (PF) 1 % IJ SOLN
30.0000 mL | INTRAMUSCULAR | Status: DC | PRN
  Filled 2014-10-05: qty 30

## 2014-10-05 MED ORDER — LACTATED RINGERS IV SOLN
500.0000 mL | Freq: Once | INTRAVENOUS | Status: AC
  Administered 2014-10-05: 500 mL via INTRAVENOUS

## 2014-10-05 MED ORDER — LIDOCAINE HCL (PF) 1 % IJ SOLN
INTRAMUSCULAR | Status: DC | PRN
Start: 2014-10-05 — End: 2014-10-06
  Administered 2014-10-05: 3 mL
  Administered 2014-10-05 (×2): 5 mL

## 2014-10-05 MED ORDER — PHENYLEPHRINE 40 MCG/ML (10ML) SYRINGE FOR IV PUSH (FOR BLOOD PRESSURE SUPPORT)
80.0000 ug | PREFILLED_SYRINGE | INTRAVENOUS | Status: DC | PRN
  Filled 2014-10-05: qty 2

## 2014-10-05 MED ORDER — LACTATED RINGERS IV SOLN
INTRAVENOUS | Status: DC
  Administered 2014-10-06: 08:00:00 via INTRAVENOUS

## 2014-10-05 MED ORDER — PHENYLEPHRINE 40 MCG/ML (10ML) SYRINGE FOR IV PUSH (FOR BLOOD PRESSURE SUPPORT)
PREFILLED_SYRINGE | INTRAVENOUS | Status: AC
  Filled 2014-10-05: qty 20

## 2014-10-05 MED ORDER — OXYCODONE-ACETAMINOPHEN 5-325 MG PO TABS: 1.0000 | ORAL_TABLET | ORAL | Status: DC | PRN

## 2014-10-05 MED ORDER — FENTANYL 2.5 MCG/ML BUPIVACAINE 1/10 % EPIDURAL INFUSION (WH - ANES)
14.0000 mL/h | INTRAMUSCULAR | Status: DC | PRN
  Administered 2014-10-05 – 2014-10-06 (×2): 14 mL/h via EPIDURAL
  Filled 2014-10-05: qty 125

## 2014-10-05 MED ORDER — OXYCODONE-ACETAMINOPHEN 5-325 MG PO TABS: 2.0000 | ORAL_TABLET | ORAL | Status: DC | PRN

## 2014-10-05 MED ORDER — OXYTOCIN BOLUS FROM INFUSION: 500.0000 mL | INTRAVENOUS | Status: DC

## 2014-10-05 MED ORDER — DIPHENHYDRAMINE HCL 50 MG/ML IJ SOLN: 12.5000 mg | INTRAMUSCULAR | Status: DC | PRN

## 2014-10-05 MED ORDER — CITRIC ACID-SODIUM CITRATE 334-500 MG/5ML PO SOLN
30.0000 mL | ORAL | Status: DC | PRN
Start: 2014-10-05 — End: 2014-10-06
  Administered 2014-10-06: 30 mL via ORAL
  Filled 2014-10-05: qty 15

## 2014-10-05 MED ORDER — OXYTOCIN 40 UNITS IN LACTATED RINGERS INFUSION - SIMPLE MED
62.5000 mL/h | INTRAVENOUS | Status: DC
  Administered 2014-10-06: 62.5 mL/h via INTRAVENOUS
  Filled 2014-10-05: qty 1000

## 2014-10-05 MED ORDER — ONDANSETRON HCL 4 MG/2ML IJ SOLN: 4.0000 mg | Freq: Four times a day (QID) | INTRAMUSCULAR | Status: DC | PRN

## 2014-10-05 NOTE — Anesthesia Procedure Notes (Signed)
Epidural Patient location during procedure: OB  Staffing Anesthesiologist: Damani Rando Performed by: anesthesiologist   Preanesthetic Checklist Completed: patient identified, site marked, surgical consent, pre-op evaluation, timeout performed, IV checked, risks and benefits discussed and monitors and equipment checked  Epidural Patient position: sitting Prep: ChloraPrep Patient monitoring: heart rate, continuous pulse ox and blood pressure Approach: right paramedian Location: L3-L4 Injection technique: LOR saline  Needle:  Needle type: Tuohy  Needle gauge: 17 G Needle length: 9 cm and 9 Needle insertion depth: 5 cm Catheter type: closed end flexible Catheter size: 20 Guage Catheter at skin depth: 10 cm Test dose: negative  Assessment Events: blood not aspirated, injection not painful, no injection resistance, negative IV test and no paresthesia  Additional Notes   Patient tolerated the insertion well without complications.   

## 2014-10-05 NOTE — MAU Note (Signed)
Downtime note:  Pt presents with contractions every 5 min and pt states she was 2 cm today and 30% in offce. Denies any leaking or bleeding.

## 2014-10-05 NOTE — MAU Note (Signed)
Pt presented to MAU having contractions every 3 minutes. Pt denies leaking of fluid and bleeding.

## 2014-10-05 NOTE — Anesthesia Preprocedure Evaluation (Signed)

## 2014-10-05 NOTE — MAU Note (Signed)
EPIC downtime, see paper chart.

## 2014-10-06 ENCOUNTER — Encounter (HOSPITAL_COMMUNITY): Payer: Self-pay | Admitting: *Deleted

## 2014-10-06 LAB — GROUP B STREP BY PCR: Group B strep by PCR: NEGATIVE

## 2014-10-06 LAB — ABO/RH: ABO/RH(D): A POS

## 2014-10-06 LAB — OB RESULTS CONSOLE GBS: GBS: NEGATIVE

## 2014-10-06 MED ORDER — SENNOSIDES-DOCUSATE SODIUM 8.6-50 MG PO TABS
2.0000 | ORAL_TABLET | ORAL | Status: DC
  Administered 2014-10-06: 2 via ORAL
  Filled 2014-10-06: qty 2

## 2014-10-06 MED ORDER — ZOLPIDEM TARTRATE 5 MG PO TABS: 5.0000 mg | ORAL_TABLET | Freq: Every evening | ORAL | Status: DC | PRN

## 2014-10-06 MED ORDER — PRENATAL MULTIVITAMIN CH
1.0000 | ORAL_TABLET | Freq: Every day | ORAL | Status: DC
  Administered 2014-10-06: 1 via ORAL
  Filled 2014-10-06: qty 1

## 2014-10-06 MED ORDER — ONDANSETRON HCL 4 MG PO TABS: 4.0000 mg | ORAL_TABLET | ORAL | Status: DC | PRN

## 2014-10-06 MED ORDER — ONDANSETRON HCL 4 MG/2ML IJ SOLN: 4.0000 mg | INTRAMUSCULAR | Status: DC | PRN

## 2014-10-06 MED ORDER — WITCH HAZEL-GLYCERIN EX PADS
1.0000 "application " | MEDICATED_PAD | CUTANEOUS | Status: DC | PRN
  Administered 2014-10-06: 1 via TOPICAL

## 2014-10-06 MED ORDER — LANOLIN HYDROUS EX OINT: TOPICAL_OINTMENT | CUTANEOUS | Status: DC | PRN

## 2014-10-06 MED ORDER — OXYCODONE-ACETAMINOPHEN 5-325 MG PO TABS
1.0000 | ORAL_TABLET | ORAL | Status: DC | PRN
Start: 2014-10-06 — End: 2014-10-07

## 2014-10-06 MED ORDER — OXYCODONE-ACETAMINOPHEN 5-325 MG PO TABS
2.0000 | ORAL_TABLET | ORAL | Status: DC | PRN
Start: 2014-10-06 — End: 2014-10-07

## 2014-10-06 MED ORDER — BENZOCAINE-MENTHOL 20-0.5 % EX AERO
1.0000 "application " | INHALATION_SPRAY | CUTANEOUS | Status: DC | PRN
  Administered 2014-10-06: 1 via TOPICAL
  Filled 2014-10-06: qty 56

## 2014-10-06 MED ORDER — IBUPROFEN 600 MG PO TABS
600.0000 mg | ORAL_TABLET | Freq: Four times a day (QID) | ORAL | Status: DC
  Administered 2014-10-06 – 2014-10-07 (×5): 600 mg via ORAL
  Filled 2014-10-06 (×5): qty 1

## 2014-10-06 MED ORDER — SIMETHICONE 80 MG PO CHEW: 80.0000 mg | CHEWABLE_TABLET | ORAL | Status: DC | PRN

## 2014-10-06 MED ORDER — MEASLES, MUMPS & RUBELLA VAC ~~LOC~~ INJ
0.5000 mL | INJECTION | Freq: Once | SUBCUTANEOUS | Status: AC
  Administered 2014-10-07: 0.5 mL via SUBCUTANEOUS
  Filled 2014-10-06 (×2): qty 0.5

## 2014-10-06 MED ORDER — MEDROXYPROGESTERONE ACETATE 150 MG/ML IM SUSP: 150.0000 mg | INTRAMUSCULAR | Status: DC | PRN

## 2014-10-06 MED ORDER — DIBUCAINE 1 % RE OINT
1.0000 "application " | TOPICAL_OINTMENT | RECTAL | Status: DC | PRN
  Administered 2014-10-06: 1 via RECTAL
  Filled 2014-10-06: qty 28

## 2014-10-06 MED ORDER — DIPHENHYDRAMINE HCL 25 MG PO CAPS: 25.0000 mg | ORAL_CAPSULE | Freq: Four times a day (QID) | ORAL | Status: DC | PRN

## 2014-10-06 MED ORDER — TETANUS-DIPHTH-ACELL PERTUSSIS 5-2.5-18.5 LF-MCG/0.5 IM SUSP: 0.5000 mL | Freq: Once | INTRAMUSCULAR | Status: DC

## 2014-10-06 NOTE — H&P (Signed)
Margaret NeedyStephanie Hayden is a 34 y.o. female presenting for SOL. Maternal Medical History:  Reason for admission: Contractions.   Contractions: Onset was 6-12 hours ago.   Frequency: regular.   Perceived severity is moderate.    Fetal activity: Perceived fetal activity is normal.   Last perceived fetal movement was within the past hour.      OB History    Gravida Para Term Preterm AB TAB SAB Ectopic Multiple Living   2 1 1       1      Past Medical History  Diagnosis Date  . Kidney stone    Past Surgical History  Procedure Laterality Date  . No past surgeries     Family History: family history is not on file. Social History:  reports that she has never smoked. She does not have any smokeless tobacco history on file. She reports that she does not drink alcohol or use illicit drugs.   Prenatal Transfer Tool  Maternal Diabetes: No Genetic Screening: Normal Maternal Ultrasounds/Referrals: Normal Fetal Ultrasounds or other Referrals:  None Maternal Substance Abuse:  No Significant Maternal Medications:  None Significant Maternal Lab Results:  None Other Comments:  None  ROS  Dilation: 8 Effacement (%): 90 Station: -2, -1 Exam by:: L.Mears,Rn Blood pressure 119/65, pulse 87, temperature 98.2 F (36.8 C), temperature source Oral, resp. rate 16, height 5\' 5"  (1.651 m), weight 171 lb (77.565 kg), last menstrual period 10/16/2013, SpO2 100 %, unknown if currently breastfeeding. Maternal Exam:  Uterine Assessment: Contraction strength is moderate.  Contraction frequency is regular.   Abdomen: Patient reports no abdominal tenderness. Fundal height is term FH.   Estimated fetal weight is AGA.   Fetal presentation: vertex  Introitus: Normal vulva. Normal vagina.  Pelvis: adequate for delivery.   Cervix: Cervix evaluated by digital exam.     Physical Exam  Constitutional: She is oriented to person, place, and time. She appears well-developed and well-nourished.  HENT:  Head:  Normocephalic and atraumatic.  Neck: Normal range of motion. Neck supple.  Cardiovascular: Normal rate and regular rhythm.   Respiratory: Effort normal and breath sounds normal.  GI:  Term FH  FHR 152  Genitourinary:  8/C/-2>>>AROM>>clr  Musculoskeletal: Normal range of motion.  Neurological: She is alert and oriented to person, place, and time.    Prenatal labs: ABO, Rh: --/--/A POS, A POS (01/13 2115) Antibody: NEG (01/13 2115) Rubella: Nonimmune (06/23 0000) RPR: Nonreactive (06/23 0000)  HBsAg: Negative (06/23 0000)  HIV: Non-reactive (06/23 0000)  GBS:     Assessment/Plan: Term IUP>>SOL, rapid GBS screen, pt re[ports NEG   Tyson Parkison M 10/06/2014, 5:52 AM

## 2014-10-06 NOTE — Progress Notes (Signed)
UR chart review completed.  

## 2014-10-06 NOTE — Progress Notes (Signed)
Patient was referred for history of depression/anxiety. * Referral screened out by Clinical Social Worker because none of the following criteria appear to apply: ~ History of anxiety/depression during this pregnancy, or of post-partum depression. ~ Diagnosis of anxiety and/or depression within last 3 years ~ History of depression due to pregnancy loss/loss of child OR * Patient's symptoms currently being treated with medication and/or therapy. Please contact the Clinical Social Worker if needs arise, or if patient requests.  PNR states MOB has "past hx of GAD; no medication in several years." 

## 2014-10-07 LAB — RPR: RPR: NONREACTIVE

## 2014-10-07 LAB — CBC
HCT: 31.9 % — ABNORMAL LOW (ref 36.0–46.0)
HEMOGLOBIN: 10.8 g/dL — AB (ref 12.0–15.0)
MCH: 32.3 pg (ref 26.0–34.0)
MCHC: 33.9 g/dL (ref 30.0–36.0)
MCV: 95.5 fL (ref 78.0–100.0)
Platelets: 182 10*3/uL (ref 150–400)
RBC: 3.34 MIL/uL — ABNORMAL LOW (ref 3.87–5.11)
RDW: 13.4 % (ref 11.5–15.5)
WBC: 11 10*3/uL — ABNORMAL HIGH (ref 4.0–10.5)

## 2014-10-07 NOTE — Lactation Note (Signed)
This note was copied from the chart of Margaret Mickeal NeedyStephanie Montminy. Lactation Consultation Note  Mother holding sleepy baby. She states she started supplementing w/ formula because her nipples are very sore and baby has a short tongue. Asked if baby if has a frenulum issue and mother states you mean the piece of skin under the tongue and she said yes, my son had it and I have it. "We all have short tongues". Mother stated she had milk supply issues with first baby and started supplementing too. Provided education that frenulum issues can cause soreness, milk supply issues, problems transferring breastmilk and she should discuss it with her Pediatrician. Discussed that often it is not a milk supply issue but the difficulty the baby has with suck  Offered to have mother call when baby wakes to view latch and assess baby's mouth but mother stated she will be going home as soon as possible. Mother states she does have a DEBP.  She also stated if it gets bad she has a nipple shield and will use it.  Offered to fit her w/ NS and she refused. Reviewed supply and demand.  Encouraged mother to call to view latch.  Mother has declined assist. Mom made aware of O/P services, breastfeeding support groups, community resources, and our phone # for post-discharge questions.    Patient Name: Margaret Hayden [id number]'s Date: 10/07/2014 Reason for consult: Initial assessment   Maternal Data Does the patient have breastfeeding experience prior to this delivery?: Yes  Feeding Feeding Type: Bottle Fed - Formula  LATCH Score/Interventions                      Lactation Tools Discussed/Used     Consult Status Consult Status: Complete    Hardie PulleyBerkelhammer, Adilee Lemme Boschen 10/07/2014, 9:51 AM

## 2014-10-07 NOTE — Discharge Summary (Signed)
Obstetric Discharge Summary Reason for Admission: onset of labor Prenatal Procedures: ultrasound Intrapartum Procedures: spontaneous vaginal delivery Postpartum Procedures: none Complications-Operative and Postpartum: periurethral laceration HEMOGLOBIN  Date Value Ref Range Status  10/07/2014 10.8* 12.0 - 15.0 g/dL Final   HCT  Date Value Ref Range Status  10/07/2014 31.9* 36.0 - 46.0 % Final    Physical Exam:  General: alert and cooperative Lochia: appropriate Uterine Fundus: firm Incision: healing well DVT Evaluation: No evidence of DVT seen on physical exam. Negative Homan's sign. No cords or calf tenderness. No significant calf/ankle edema.  Discharge Diagnoses: Term Pregnancy-delivered  Discharge Information: Date: 10/07/2014 Activity: pelvic rest Diet: routine Medications: PNV and Ibuprofen Condition: stable Instructions: refer to practice specific booklet Discharge to: home   Newborn Data: Live born female  Birth Weight: 7 lb 5.1 oz (3320 g) APGAR: 9, 9  Home with mother.  Clora Ohmer G 10/07/2014, 7:53 AM

## 2014-10-07 NOTE — Anesthesia Postprocedure Evaluation (Signed)
Anesthesia Post Note  Patient: Margaret NeedyStephanie Hayden  Procedure(s) Performed: * No procedures listed *  Anesthesia type: Epidural  Patient location: Mother/Baby  Post pain: Pain level controlled  Post assessment: Post-op Vital signs reviewed  Last Vitals:  Filed Vitals:   10/07/14 0627  BP: 112/65  Pulse: 72  Temp: 36.6 C  Resp: 16    Post vital signs: Reviewed  Level of consciousness:alert  Complications: No apparent anesthesia complications

## 2015-12-15 ENCOUNTER — Encounter: Payer: Self-pay | Admitting: Gastroenterology

## 2016-07-29 DIAGNOSIS — R634 Abnormal weight loss: Secondary | ICD-10-CM | POA: Insufficient documentation

## 2016-07-29 DIAGNOSIS — R630 Anorexia: Secondary | ICD-10-CM | POA: Insufficient documentation

## 2016-08-07 ENCOUNTER — Other Ambulatory Visit: Payer: Self-pay | Admitting: Unknown Physician Specialty

## 2016-08-07 DIAGNOSIS — R1011 Right upper quadrant pain: Secondary | ICD-10-CM

## 2016-08-07 DIAGNOSIS — R634 Abnormal weight loss: Secondary | ICD-10-CM

## 2016-08-07 DIAGNOSIS — R110 Nausea: Secondary | ICD-10-CM

## 2016-08-23 ENCOUNTER — Ambulatory Visit
Admission: RE | Admit: 2016-08-23 | Discharge: 2016-08-23 | Disposition: A | Discharge status: Home / Self Care | Payer: BLUE CROSS/BLUE SHIELD | Source: Ambulatory Visit | Attending: Unknown Physician Specialty | Admitting: Unknown Physician Specialty

## 2016-08-23 DIAGNOSIS — R110 Nausea: Secondary | ICD-10-CM | POA: Insufficient documentation

## 2016-08-23 DIAGNOSIS — R1011 Right upper quadrant pain: Secondary | ICD-10-CM | POA: Diagnosis present

## 2016-08-23 DIAGNOSIS — R634 Abnormal weight loss: Secondary | ICD-10-CM | POA: Diagnosis present

## 2016-08-23 MED ORDER — TECHNETIUM TC 99M MEBROFENIN IV KIT
5.0000 | PACK | Freq: Once | INTRAVENOUS | Status: AC | PRN
  Administered 2016-08-23: 4.88 via INTRAVENOUS

## 2016-08-29 ENCOUNTER — Ambulatory Visit: Payer: Self-pay

## 2016-08-30 ENCOUNTER — Other Ambulatory Visit: Payer: Self-pay | Admitting: Nurse Practitioner

## 2016-08-30 DIAGNOSIS — R109 Unspecified abdominal pain: Secondary | ICD-10-CM

## 2016-08-30 DIAGNOSIS — R634 Abnormal weight loss: Secondary | ICD-10-CM

## 2016-08-30 DIAGNOSIS — R110 Nausea: Secondary | ICD-10-CM

## 2016-09-04 ENCOUNTER — Ambulatory Visit
Admission: RE | Admit: 2016-09-04 | Discharge: 2016-09-04 | Disposition: A | Discharge status: Home / Self Care | Payer: BLUE CROSS/BLUE SHIELD | Source: Ambulatory Visit | Attending: Nurse Practitioner | Admitting: Nurse Practitioner

## 2016-09-04 DIAGNOSIS — R109 Unspecified abdominal pain: Secondary | ICD-10-CM

## 2016-09-04 DIAGNOSIS — R110 Nausea: Secondary | ICD-10-CM | POA: Diagnosis present

## 2016-09-04 DIAGNOSIS — R634 Abnormal weight loss: Secondary | ICD-10-CM | POA: Diagnosis present

## 2016-09-04 DIAGNOSIS — N281 Cyst of kidney, acquired: Secondary | ICD-10-CM | POA: Diagnosis not present

## 2016-09-04 MED ORDER — IOPAMIDOL (ISOVUE-300) INJECTION 61%
100.0000 mL | Freq: Once | INTRAVENOUS | Status: AC | PRN
  Administered 2016-09-04: 100 mL via INTRAVENOUS

## 2016-09-12 ENCOUNTER — Ambulatory Visit (INDEPENDENT_AMBULATORY_CARE_PROVIDER_SITE_OTHER): Payer: BLUE CROSS/BLUE SHIELD | Admitting: Primary Care

## 2016-09-12 ENCOUNTER — Encounter: Payer: Self-pay | Admitting: Primary Care

## 2016-09-12 VITALS — BP 110/74 | HR 80 | Temp 97.8°F | Ht 64.5 in | Wt 143.0 lb

## 2016-09-12 DIAGNOSIS — F411 Generalized anxiety disorder: Secondary | ICD-10-CM | POA: Diagnosis not present

## 2016-09-12 MED ORDER — ESCITALOPRAM OXALATE 10 MG PO TABS: 10.0000 mg | ORAL_TABLET | Freq: Every day | ORAL | 1 refills | Status: DC

## 2016-09-12 NOTE — Progress Notes (Signed)
Pre visit review using our clinic review tool, if applicable. No additional management support is needed unless otherwise documented below in the visit note. 

## 2016-09-12 NOTE — Patient Instructions (Signed)
Start Lexapro 10 mg for anxiety. Start by taking 1/2 tablet daily for 8 days, then increase to 1 full tablet thereafter.  Follow up in 6 weeks for re-evaluation of anxiety. Please call me sooner if you experience those symptoms discussed today for longer than 2 weeks.  It was a pleasure to meet you today! Please don't hesitate to call me with any questions. Welcome to [facility]!

## 2016-09-12 NOTE — Assessment & Plan Note (Signed)
Prior history of anxiety disorder, treated with Xanax. GAD 7 score of 15 today.  Discussed treatment options and she elects for medication.  Rx for Lexapro 10 mg. Patient is to take 1/2 tablet daily for 8 days, then advance to 1 full tablet thereafter. We discussed possible side effects of headache, GI upset, drowsiness, and SI/HI. If thoughts of SI/HI develop, we discussed to present to the emergency immediately. Patient verbalized understanding.   Follow up in 6 weeks for further evaluation.

## 2016-09-12 NOTE — Progress Notes (Signed)
Subjective:    Patient ID: Margaret NeedyStephanie Hayden, female    DOB: 1981/08/18, 35 y.o.   MRN: [id number]  HPI  Ms. Margaret Hayden is a 35 year old female who presents today to establish care and discuss the problems mentioned below. Will obtain old records.  1) Generalized Anxiety Disorder: Prior history of anxiety and was previously managed on Xanax for 6 months with improvement. She's been experiencing symptoms of nausea and abdominal pain over the last 3 months and has since been very anxious regarding her symptoms. She has a fear of vomiting/feeling nauseated, she feels anxious during the day, she experiences daily worry, irritability. GAD 7 score 15. She denies depression, lack of interest.  2) Nausea/Abdominal Pain: Three months ago she began experiencing persistent nausea with RUQ abdominal pain. She is currently following with GI who has completed extensive testing (endoscopy, ultrasound, CT, HIDA scan, labs) which has been unremarkable. She has a strong family history of gall bladder disease for which both parents have had their gallbladder removed. There was an incidental finding of a cyst on her right kidney so will be seeing a Urologist soon. She was taken off of her birth control and hasn't noticed much improvement. She's tried PPI's and other medications without improvement. She's lost 25 pounds over the last several months due to nausea. She's not had a clear answer as to the cause of her symptoms.  Review of Systems  Constitutional: Negative for fatigue and fever.  Respiratory: Negative for shortness of breath.   Cardiovascular: Negative for chest pain.  Gastrointestinal: Positive for abdominal pain and nausea.  Neurological: Negative for dizziness and weakness.  Psychiatric/Behavioral: Negative for suicidal ideas. The patient is nervous/anxious.        Past Medical History:  Diagnosis Date  . GERD (gastroesophageal reflux disease)   . Kidney stone      Social History   Social  History  . Marital status: Married    Spouse name: N/A  . Number of children: N/A  . Years of education: N/A   Occupational History  . Not on file.   Social History Main Topics  . Smoking status: Never Smoker  . Smokeless tobacco: Not on file  . Alcohol use No  . Drug use: No  . Sexual activity: Yes   Other Topics Concern  . Not on file   Social History Narrative   Married.   2 children.   Works as a Futures traderHomemaker.   Enjoys reading, drawing/painting, spending time with family.    Past Surgical History:  Procedure Laterality Date  . NO PAST SURGERIES      Family History  Problem Relation Age of Onset  . Gallbladder disease Mother   . Diabetes Mother   . Gallbladder disease Father   . Appendicitis Father   . Breast cancer Maternal Grandmother   . Colon cancer Paternal Grandfather   . Breast cancer Maternal Aunt     No Known Allergies  No current outpatient prescriptions on file prior to visit.   No current facility-administered medications on file prior to visit.     BP 110/74   Pulse 80   Temp 97.8 F (36.6 C) (Oral)   Ht 5' 4.5" (1.638 m)   Wt 143 lb (64.9 kg)   LMP 09/09/2016   SpO2 98%   BMI 24.17 kg/m    Objective:   Physical Exam  Constitutional: She appears well-nourished.  Neck: Neck supple.  Cardiovascular: Normal rate and regular rhythm.   Pulmonary/Chest: Effort  normal and breath sounds normal.  Skin: Skin is warm and dry.  Psychiatric: She has a normal mood and affect.          Assessment & Plan:

## 2016-09-12 NOTE — Assessment & Plan Note (Signed)
Following with GI for further evaluation of nausea and abdominal pain. Work up has been unremarkable. Suspect she may need a cholecystomy, especially given family history of gall bladder disease. She will be following up with GI after the new year.

## 2016-09-20 ENCOUNTER — Ambulatory Visit (INDEPENDENT_AMBULATORY_CARE_PROVIDER_SITE_OTHER): Payer: BLUE CROSS/BLUE SHIELD | Admitting: Urology

## 2016-09-20 VITALS — BP 106/71 | Ht 64.0 in | Wt 143.1 lb

## 2016-09-20 DIAGNOSIS — N281 Cyst of kidney, acquired: Secondary | ICD-10-CM | POA: Diagnosis not present

## 2016-09-20 NOTE — Progress Notes (Signed)
09/20/2016 10:25 AM   Margaret Hayden 21-Oct-1980 [id number]  Referring provider: Theadore NanKimberly A Mills, NP [phone] [facility] [address], [place] [address]  Chief Complaint  Patient presents with  . New Patient (Initial Visit)    Cyst on Right Kidney     HPI: The patient is a 35 year old female presents today to discuss incidental finding of a 2 cm right simple renal cyst found on CT scan for right upper quadrant pain.  The patient has no urinary complaints at this time. She voids well without difficulty. She has no history of hematuria or UTI. She presents today particular the urging of her husband to make sure that the cyst is nothing to be concerned about. She does have intermittent right subcostal pain that may be related to her gallbladder. She has no right flank pain.   PMH: Past Medical History:  Diagnosis Date  . GERD (gastroesophageal reflux disease)   . Kidney stone     Surgical History: Past Surgical History:  Procedure Laterality Date  . NO PAST SURGERIES      Home Medications:  Allergies as of 09/20/2016   No Known Allergies     Medication List       Accurate as of 09/20/16 10:25 AM. Always use your most recent med list.          escitalopram 10 MG tablet Commonly known as:  LEXAPRO Take 1 tablet (10 mg total) by mouth daily.   omeprazole 20 MG capsule Commonly known as:  PRILOSEC Take by mouth.   ondansetron 4 MG disintegrating tablet Commonly known as:  ZOFRAN-ODT TAKE 1 TABLET (4 MG TOTAL) BY MOUTH EVERY 8 (EIGHT) HOURS AS NEEDED FOR NAUSEA.   promethazine 25 MG tablet Commonly known as:  PHENERGAN Take 25 mg by mouth every 6 (six) hours as needed for nausea or vomiting.   TRI-LO-MARZIA 0.18/0.215/0.25 MG-25 MCG tab Generic drug:  Norgestimate-Ethinyl Estradiol Triphasic Take by mouth.       Allergies: No Known Allergies  Family History: Family History  Problem Relation Age of Onset  . Gallbladder disease  Mother   . Diabetes Mother   . Gallbladder disease Father   . Appendicitis Father   . Breast cancer Maternal Grandmother   . Colon cancer Paternal Grandfather   . Breast cancer Maternal Aunt     Social History:  reports that she has never smoked. She does not have any smokeless tobacco history on file. She reports that she does not drink alcohol or use drugs.  ROS:                                        Physical Exam: BP 106/71 (BP Location: Right Arm)   Ht 5\' 4"  (1.626 m)   Wt 143 lb 1.6 oz (64.9 kg)   LMP 09/09/2016   BMI 24.56 kg/m   Constitutional:  Alert and oriented, No acute distress. HEENT: [place] AT, moist mucus membranes.  Trachea midline, no masses. Cardiovascular: No clubbing, cyanosis, or edema. Respiratory: Normal respiratory effort, no increased work of breathing. GI: Abdomen is soft, nontender, nondistended, no abdominal masses GU: No CVA tenderness.  Skin: No rashes, bruises or suspicious lesions. Lymph: No cervical or inguinal adenopathy. Neurologic: Grossly intact, no focal deficits, moving all 4 extremities. Psychiatric: Normal mood and affect.  Laboratory Data: Lab Results  Component Value Date   WBC 11.0 (  H) 10/07/2014   HGB 10.8 (L) 10/07/2014   HCT 31.9 (L) 10/07/2014   MCV 95.5 10/07/2014   PLT 182 10/07/2014    No results found for: CREATININE  No results found for: PSA  No results found for: TESTOSTERONE  No results found for: HGBA1C  Urinalysis    Component Value Date/Time   COLORURINE YELLOW 09/21/2014 1148   APPEARANCEUR HAZY (A) 09/21/2014 1148   LABSPEC 1.025 09/21/2014 1148   PHURINE 6.5 09/21/2014 1148   GLUCOSEU NEGATIVE 09/21/2014 1148   HGBUR NEGATIVE 09/21/2014 1148   BILIRUBINUR NEGATIVE 09/21/2014 1148   KETONESUR >80 (A) 09/21/2014 1148   PROTEINUR NEGATIVE 09/21/2014 1148   UROBILINOGEN 0.2 09/21/2014 1148   NITRITE NEGATIVE 09/21/2014 1148   LEUKOCYTESUR TRACE (A) 09/21/2014 1148     Pertinent Imaging: CT reviewed as above. Positive for right 2.1 similar lower pole simple cyst.  Assessment & Plan:    1. Right renal cyst This is benign. No further workup is indicated. The patient can follow-up as needed.  Return if symptoms worsen or fail to improve.  Hildred LaserBrian James Dewaine Morocho, MD  [facility] [address], [address] [place], [place] [address] [phone]

## 2016-09-24 ENCOUNTER — Other Ambulatory Visit: Payer: Self-pay | Admitting: Primary Care

## 2016-09-24 ENCOUNTER — Encounter: Payer: Self-pay | Admitting: Primary Care

## 2016-09-24 DIAGNOSIS — F411 Generalized anxiety disorder: Secondary | ICD-10-CM

## 2016-09-24 MED ORDER — SERTRALINE HCL 50 MG PO TABS: 50.0000 mg | ORAL_TABLET | Freq: Every day | ORAL | 1 refills | Status: DC

## 2016-10-23 ENCOUNTER — Ambulatory Visit (INDEPENDENT_AMBULATORY_CARE_PROVIDER_SITE_OTHER): Payer: BLUE CROSS/BLUE SHIELD | Admitting: Primary Care

## 2016-10-23 ENCOUNTER — Encounter: Payer: Self-pay | Admitting: Primary Care

## 2016-10-23 VITALS — BP 116/76 | HR 95 | Temp 97.8°F | Ht 64.0 in | Wt 141.4 lb

## 2016-10-23 DIAGNOSIS — R1011 Right upper quadrant pain: Secondary | ICD-10-CM | POA: Diagnosis not present

## 2016-10-23 DIAGNOSIS — F411 Generalized anxiety disorder: Secondary | ICD-10-CM

## 2016-10-23 MED ORDER — HYDROXYZINE HCL 25 MG PO TABS: 25.0000 mg | ORAL_TABLET | Freq: Two times a day (BID) | ORAL | 0 refills | Status: DC | PRN

## 2016-10-23 NOTE — Assessment & Plan Note (Signed)
Referral placed to [facility] [place] for second opinion on GI symptoms.

## 2016-10-23 NOTE — Progress Notes (Signed)
Pre visit review using our clinic review tool, if applicable. No additional management support is needed unless otherwise documented below in the visit note. 

## 2016-10-23 NOTE — Patient Instructions (Signed)
You will be contacted regarding your referral to GI.  Please let us know if you have not heard back within one week.   Ear Pressure/Pain: Try using Flonase (fluticasone) nasal spray. Instill 1 spray in each nostril twice daily.   You may take hydroxyzine tablets up to twice daily as needed for breakthrough anxiety. Please keep me updated regarding your symptoms.  It was a pleasure to see you today!

## 2016-10-23 NOTE — Assessment & Plan Note (Signed)
Zoloft caused increased nausea and GI upset, could not tolerate more than three 1/2 tablets. Long discussion today regarding symptoms and treatment options. Do believe that this is all related to her GI symptoms. Discussed that I do not support use of Benzo's for treatment. Will supply short term supply of hydroxyzine to use PRN. She will notify if symptoms do not improve.

## 2016-10-23 NOTE — Progress Notes (Signed)
Subjective:    Patient ID: Margaret Hayden, female    DOB: [birth year], 37 y.o.   MRN: [id number]  HPI  Margaret Hayden is a 36 year old female who presents today with a chief complaint of ear pain and for follow up of generalized anxiety disorder.  1) Ear Pain: Located to left ear. She also reports ear fullness. Her symptoms have been present to the left ear that she first noticed last night. She denies fevers, sore throat, cough. She has noticed rhinorrhea. Her father in law was diagnosed with influenza 4 days ago.   2) GAD: Evaluated as a new patient on 09/12/16 for complaints of anxiety regarding her chronic abdominal pain and nausea. Her GAD 7 score was 15 that day. We discussed treatment options and she elected for oral medication. We initiated Zoloft 50 mg.   Since her last visit she stopped taking the Zoloft as it caused her to feel sick. She took about three 1/2 pills of her Zoloft which caused nausea, upset stomach before she stopped. She's been taking her mother's Xanax with improvement. She would like to have an "as needed" medication as she knows her anxiety is due to her abdominal symptoms. She would like another referral to GI for her persistent nausea and right upper quadrant abdominal pain. She thinks she needs her gallbladder removed. She's undergone extensive work up per GI through [facility] and would like a second opinion.   Review of Systems  Constitutional: Negative for chills and fever.  HENT: Positive for ear pain and rhinorrhea. Negative for congestion and sore throat.   Respiratory: Negative for shortness of breath.   Cardiovascular: Negative for chest pain.  Gastrointestinal: Positive for abdominal pain and nausea. Negative for vomiting.  Psychiatric/Behavioral: The patient is nervous/anxious.        Past Medical History:  Diagnosis Date  . GERD (gastroesophageal reflux disease)   . Kidney stone      Social History   Social History  . Marital status:  Married    Spouse name: N/A  . Number of children: N/A  . Years of education: N/A   Occupational History  . Not on file.   Social History Main Topics  . Smoking status: Never Smoker  . Smokeless tobacco: Never Used  . Alcohol use No  . Drug use: No  . Sexual activity: Yes   Other Topics Concern  . Not on file   Social History Narrative   Married.   2 children.   Works as a Futures trader.   Enjoys reading, drawing/painting, spending time with family.    Past Surgical History:  Procedure Laterality Date  . NO PAST SURGERIES      Family History  Problem Relation Age of Onset  . Gallbladder disease Mother   . Diabetes Mother   . Gallbladder disease Father   . Appendicitis Father   . Breast cancer Maternal Grandmother   . Colon cancer Paternal Grandfather   . Breast cancer Maternal Aunt     No Known Allergies  Current Outpatient Prescriptions on File Prior to Visit  Medication Sig Dispense Refill  . Norgestimate-Ethinyl Estradiol Triphasic (TRI-LO-MARZIA) 0.18/0.215/0.25 MG-25 MCG tab Take by mouth.    Marland Kitchen omeprazole (PRILOSEC) 20 MG capsule Take by mouth.    . ondansetron (ZOFRAN-ODT) 4 MG disintegrating tablet TAKE 1 TABLET (4 MG TOTAL) BY MOUTH EVERY 8 (EIGHT) HOURS AS NEEDED FOR NAUSEA.  1  . promethazine (PHENERGAN) 25 MG tablet Take 25 mg by mouth every  6 (six) hours as needed for nausea or vomiting.     No current facility-administered medications on file prior to visit.     BP 116/76   Pulse 95   Temp 97.8 F (36.6 C) (Oral)   Ht 5\' 4"  (1.626 m)   Wt 141 lb 6.4 oz (64.1 kg)   LMP 09/25/2016 (Within Days)   SpO2 98%   BMI 24.27 kg/m    Objective:   Physical Exam  Constitutional: She appears well-nourished.  HENT:  Right Ear: Tympanic membrane and ear canal normal.  Left Ear: Ear canal normal. Tympanic membrane is not erythematous.  Nose: Right sinus exhibits no maxillary sinus tenderness and no frontal sinus tenderness. Left sinus exhibits no  maxillary sinus tenderness and no frontal sinus tenderness.  Mouth/Throat: Oropharynx is clear and moist.  Effusion noted to left TM  Eyes: Conjunctivae are normal.  Neck: Neck supple.  Cardiovascular: Normal rate and regular rhythm.   Pulmonary/Chest: Effort normal and breath sounds normal. She has no wheezes. She has no rales.  Lymphadenopathy:    She has no cervical adenopathy.  Skin: Skin is warm and dry.          Assessment & Plan:  Ear Fullness:  Located to left ear x 24 hours.  No fevers, chills, sore throat, body aches, cough. Exam today unremarkable except for effusion to left TM. Will trial Flonase. If no improvement, add Zyrtec. Follow up PRN.  Morrie Sheldonlark,Quana Chamberlain Kendal, NP

## 2016-10-25 ENCOUNTER — Ambulatory Visit: Payer: BLUE CROSS/BLUE SHIELD | Admitting: Primary Care

## 2016-11-08 ENCOUNTER — Telehealth: Payer: Self-pay | Admitting: Primary Care

## 2016-11-08 ENCOUNTER — Telehealth: Payer: Self-pay | Admitting: Gastroenterology

## 2016-11-08 DIAGNOSIS — R1011 Right upper quadrant pain: Secondary | ICD-10-CM

## 2016-11-08 NOTE — Telephone Encounter (Signed)
I spoke to Margaret Hayden at [facility]. She states that they are having a hard time obtaining patients GI records. She states that referring Provider printed off GI records from "[facility]". Patient has previously been seen here by Dr. Larae GroomsJacob's. Records placed on Dr. Larae GroomsJacob's desk for review.     Patient is wanting to be seen for 2nd opinion on Gallbladder issues.

## 2016-11-08 NOTE — Telephone Encounter (Signed)
Left message for Shirlee LimerickMarion to return my call.  Per Dr. Christella HartiganJacobs, he has declined to accept patient back into the practice. He states that patient switched from Dr. Ewing SchleinMagod to him in 2014. From him to Dr. Mechele CollinElliott more recently. Multiple test, multiple MDs.

## 2016-11-08 NOTE — Telephone Encounter (Signed)
Noted, new referral placed.  

## 2016-11-08 NOTE — Telephone Encounter (Signed)
Received a call back from [facility] and they have declined to see our patient back at [facility]. Please place a New GI referral in because [facility] closed out ours an I need a new one to help your patient get a new appointment. I will be referring her to Dr Tobi BastosAnna [place]. Please put this in your comments when Referral gets put in.

## 2016-11-11 ENCOUNTER — Encounter: Payer: Self-pay | Admitting: Primary Care

## 2016-11-13 ENCOUNTER — Ambulatory Visit (INDEPENDENT_AMBULATORY_CARE_PROVIDER_SITE_OTHER): Payer: BLUE CROSS/BLUE SHIELD | Admitting: Primary Care

## 2016-11-13 ENCOUNTER — Encounter: Payer: Self-pay | Admitting: Gastroenterology

## 2016-11-13 ENCOUNTER — Encounter: Payer: Self-pay | Admitting: Primary Care

## 2016-11-13 ENCOUNTER — Ambulatory Visit (INDEPENDENT_AMBULATORY_CARE_PROVIDER_SITE_OTHER): Payer: BLUE CROSS/BLUE SHIELD | Admitting: Gastroenterology

## 2016-11-13 VITALS — BP 112/72 | HR 93 | Ht 64.5 in | Wt 138.0 lb

## 2016-11-13 VITALS — BP 110/76 | HR 93 | Temp 98.4°F | Ht 64.5 in | Wt 137.4 lb

## 2016-11-13 DIAGNOSIS — R1010 Upper abdominal pain, unspecified: Secondary | ICD-10-CM

## 2016-11-13 DIAGNOSIS — R1011 Right upper quadrant pain: Secondary | ICD-10-CM | POA: Diagnosis not present

## 2016-11-13 DIAGNOSIS — G8929 Other chronic pain: Secondary | ICD-10-CM

## 2016-11-13 LAB — LIPASE: Lipase: 28 U/L (ref 11.0–59.0)

## 2016-11-13 LAB — COMPREHENSIVE METABOLIC PANEL
ALK PHOS: 44 U/L (ref 39–117)
ALT: 8 U/L (ref 0–35)
AST: 15 U/L (ref 0–37)
Albumin: 4.6 g/dL (ref 3.5–5.2)
BILIRUBIN TOTAL: 0.5 mg/dL (ref 0.2–1.2)
BUN: 12 mg/dL (ref 6–23)
CO2: 27 meq/L (ref 19–32)
Calcium: 9.3 mg/dL (ref 8.4–10.5)
Chloride: 105 mEq/L (ref 96–112)
Creatinine, Ser: 0.69 mg/dL (ref 0.40–1.20)
GFR: 103.93 mL/min (ref >60.00)
Glucose, Bld: 89 mg/dL (ref 70–99)
POTASSIUM: 3.8 meq/L (ref 3.5–5.1)
Sodium: 138 mEq/L (ref 135–145)
TOTAL PROTEIN: 7.4 g/dL (ref 6.0–8.3)

## 2016-11-13 LAB — CBC
HEMATOCRIT: 35.8 % — AB (ref 36.0–46.0)
Hemoglobin: 12.4 g/dL (ref 12.0–15.0)
MCHC: 34.7 g/dL (ref 30.0–36.0)
MCV: 89.9 fl (ref 78.0–100.0)
PLATELETS: 258 10*3/uL (ref 150.0–400.0)
RBC: 3.98 Mil/uL (ref 3.87–5.11)
RDW: 12.9 % (ref 11.5–15.5)
WBC: 8 10*3/uL (ref 4.0–10.5)

## 2016-11-13 MED ORDER — DICYCLOMINE HCL 10 MG PO CAPS: 10.0000 mg | ORAL_CAPSULE | Freq: Three times a day (TID) | ORAL | 1 refills | Status: DC

## 2016-11-13 NOTE — Assessment & Plan Note (Signed)
Present for the past 6 months. Full work up per GI which was unremarkable. No recent labs on file. Check Lipase, CBC, CMP today. Try Bentyl for possible IBS. If no improvement on bentyl, consider increasing PPI for 4 weeks, then reducing dose. She will see GI in March.

## 2016-11-13 NOTE — Progress Notes (Signed)
Gastroenterology Consultation  Referring Provider:     Doreene Nest, NP Primary Care Physician:  Morrie Sheldon, NP Primary Gastroenterologist:  Dr. Wyline Mood  Reason for Consultation:     Second opinion abdominal pain .         HPI:   Margaret Hayden is a 36 y.o. y/o female referred for consultation & management  by Dr. Morrie Sheldon, NP.   She is here today referred for RUQ abdominal pain . She has previously been a patient of the [facility] [place] . She is here for a second opinion.  Prior evaluation has been a normal EGD in 2017 , normal ultrasound, HIDA scan with EF of 46 %. Concern of weight loss of >100 lbs . H pylori breath test, LFT's  negative in 07/2016 .celiac serology has also been negative in the past  There has been concern that it may have been due to stress . It appears she was a patient of Dr Christella Hartigan in the past , then switched to Dr Ewing Schlein and then Dr Markham Jordan.  No colonoscopy.   Since 05/2016 lost 22 lbs per [facility] records.   Abdominal pain: Onset: Began a few months back , gradually getting worse, every day , on and off , usually after she eats, several hours in duration . Usually 30 minutes after meals.  Site :RUQ  Radiation: radiates to her back  Severity :7/10  Nature of pain: sharp  Aggravating factors: eating  Relieving factors :time Weight loss: yes NSAID use: none  PPI use :omeprazole- does not help, take sit every day  Gall bladder surgery: still present , both parents had gall bladders taken out, all 3 grandparents had their gall bladders taken out, was on oral contraceptive  Frequency of bowel movements: usually daily , soft , used to go every other day  Change in bowel movements: stable Relief with bowel movements: no  Gas/Bloating/Abdominal distension: some and feels not connected.   Stress does not make the pain worse.   Nausea:  Since September. Worse at times after meals. 5 times a week. Usually in the afternoon . Sometimes  has a "full feeling" , sometimes nausea is more after meals. Does not throw up .  Nausea better after after a hot shower, no expsure to marijuana. Only takes omeprazole . Received a script for bentyl today , not yet started.   She says the nausea is so bad that she does not eat and feels she cant eat . She is anxious but attributes it to her medical issues.    BP 112/72   Pulse 93   Ht 5' 4.5" (1.638 m)   Wt 138 lb (62.6 kg)   LMP 11/04/2016   BMI 23.32 kg/m    Past Medical History:  Diagnosis Date  . GERD (gastroesophageal reflux disease)   . Kidney stone     Past Surgical History:  Procedure Laterality Date  . NO PAST SURGERIES      Prior to Admission medications   Medication Sig Start Date End Date Taking? Authorizing Provider  dicyclomine (BENTYL) 10 MG capsule Take 1 capsule (10 mg total) by mouth 4 (four) times daily -  before meals and at bedtime. 11/13/16  Yes Doreene Nest, NP  hydrOXYzine (ATARAX/VISTARIL) 25 MG tablet Take 1 tablet (25 mg total) by mouth 2 (two) times daily as needed for anxiety. 10/23/16  Yes Doreene Nest, NP  Norgestimate-Ethinyl Estradiol Triphasic (TRI-LO-MARZIA) 0.18/0.215/0.25 MG-25 MCG tab Take by mouth. 07/26/16  Yes Historical Provider, MD  omeprazole (PRILOSEC) 20 MG capsule Take by mouth.   Yes Historical Provider, MD  promethazine (PHENERGAN) 25 MG tablet Take 25 mg by mouth every 6 (six) hours as needed for nausea or vomiting.   Yes Historical Provider, MD    Family History  Problem Relation Age of Onset  . Gallbladder disease Mother   . Diabetes Mother   . Gallbladder disease Father   . Appendicitis Father   . Breast cancer Maternal Grandmother   . Colon cancer Paternal Grandfather   . Breast cancer Maternal Aunt      Social History  Substance Use Topics  . Smoking status: Never Smoker  . Smokeless tobacco: Never Used  . Alcohol use No    Allergies as of 11/13/2016  . (No Known Allergies)    Review of Systems:      All systems reviewed and negative except where noted in HPI.   Physical Exam:  BP 112/72   Pulse 93   Ht 5' 4.5" (1.638 m)   Wt 138 lb (62.6 kg)   LMP 11/04/2016   BMI 23.32 kg/m  Patient's last menstrual period was 11/04/2016. Psych:  Alert and cooperative. Normal mood and affect. General:   Alert,  Well-developed, well-nourished, pleasant and cooperative in NAD Head:  Normocephalic and atraumatic. Eyes:  Sclera clear, no icterus.   Conjunctiva pink. Ears:  Normal auditory acuity. Nose:  No deformity, discharge, or lesions. Mouth:  No deformity or lesions,oropharynx pink & moist. Neck:  Supple; no masses or thyromegaly. Lungs:  Respirations even and unlabored.  Clear throughout to auscultation.   No wheezes, crackles, or rhonchi. No acute distress. Heart:  Regular rate and rhythm; no murmurs, clicks, rubs, or gallops. Abdomen:  Normal bowel sounds.  No bruits.  Soft, non-tender and non-distended without masses, hepatosplenomegaly or hernias noted.  No guarding or rebound tenderness.    Msk:  Symmetrical without gross deformities. Good, equal movement & strength bilaterally. Pulses:  Normal pulses noted. Extremities:  No clubbing or edema.  No cyanosis. Neurologic:  Alert and oriented x3;  grossly normal neurologically. Psych:  Alert and cooperative. Normal mood and affect.  Imaging Studies: No results found.  Assessment and Plan:    [name] 36 y.o. female here today for a second opinion for long standing dyspeptic symptoms. Her RUQ pain is post prandial , strong family history of gall stones. Prior imaging showed no gall stones. I explained very rarely sludge can still be present which can cause pain . Explained other differentials are functional dyspepsia, gastroparesis   Plan   1. CRP` 2. Gastric emptying study  3. Peppermint oil tablets trial after she tries bentyl and if its failus 4. IF no better then colonoscopy and if negative will obtain surgical opinion  for cholecystectomy. She is aware that taking her gall bladder out may or may not help with her pain and very occasionally if she has (spincter of oddi dysfunction type 3) may actually make the pain worse.   Follow up in 4 weeks   Dr Wyline MoodKiran Moneisha Vosler MD

## 2016-11-13 NOTE — Progress Notes (Signed)
Subjective:    Patient ID: Margaret NeedyStephanie Hayden, female    DOB: February 27, 1981, 36 y.o.   MRN: [id number]  HPI  Margaret Hayden is a 36 year old female who presents today with a chief complaint of abdominal pain. She has chronic RUQ abdominal pain that has been evaluated by two GI physicians over the last several years. She has a strong family history of gallbladder problems in most family members. She's undergone complete work up (CT abdomen/pelvis, labs, HIDA scan) and continues to experience abdominal pain with bloating, nausea, and decrease in appetite. It was thought that her symptoms were related to anxiety and was placed on antianxiety treatment for which she took for less than one week.  Today she continues to experience RUQ abdominal pain, decrease in appetite, nausea. Her pain has increased which is now stabbing. Her pain continues to radiate around to her right flank. She's noticed epigastric pain that is "burning" in nature which began 2 weeks ago. She's nauseated 4-5 times weekly on average for which she takes phenergan which causes drowsiness. She's still taking the omeprazole. She is scheduled to see GI in March 2018 but doesn't think she can wait that long. She denies diarrhea, bloody stools, vomiting.   Review of Systems  Constitutional: Negative for chills, fatigue and fever.  Respiratory: Negative for shortness of breath.   Cardiovascular: Negative for chest pain.  Gastrointestinal: Positive for abdominal pain and nausea. Negative for blood in stool, diarrhea and vomiting.  Neurological: Negative for weakness.       Past Medical History:  Diagnosis Date  . GERD (gastroesophageal reflux disease)   . Kidney stone      Social History   Social History  . Marital status: Married    Spouse name: N/A  . Number of children: N/A  . Years of education: N/A   Occupational History  . Not on file.   Social History Main Topics  . Smoking status: Never Smoker  . Smokeless tobacco:  Never Used  . Alcohol use No  . Drug use: No  . Sexual activity: Yes   Other Topics Concern  . Not on file   Social History Narrative   Married.   2 children.   Works as a Futures traderHomemaker.   Enjoys reading, drawing/painting, spending time with family.    Past Surgical History:  Procedure Laterality Date  . NO PAST SURGERIES      Family History  Problem Relation Age of Onset  . Gallbladder disease Mother   . Diabetes Mother   . Gallbladder disease Father   . Appendicitis Father   . Breast cancer Maternal Grandmother   . Colon cancer Paternal Grandfather   . Breast cancer Maternal Aunt     No Known Allergies  Current Outpatient Prescriptions on File Prior to Visit  Medication Sig Dispense Refill  . hydrOXYzine (ATARAX/VISTARIL) 25 MG tablet Take 1 tablet (25 mg total) by mouth 2 (two) times daily as needed for anxiety. 60 tablet 0  . Norgestimate-Ethinyl Estradiol Triphasic (TRI-LO-MARZIA) 0.18/0.215/0.25 MG-25 MCG tab Take by mouth.    Marland Kitchen. omeprazole (PRILOSEC) 20 MG capsule Take by mouth.    . promethazine (PHENERGAN) 25 MG tablet Take 25 mg by mouth every 6 (six) hours as needed for nausea or vomiting.     No current facility-administered medications on file prior to visit.     BP 110/76   Pulse 93   Temp 98.4 F (36.9 C) (Oral)   Ht 5' 4.5" (1.638 m)  Wt 137 lb 6.4 oz (62.3 kg)   LMP 11/04/2016   SpO2 99%   BMI 23.22 kg/m    Objective:   Physical Exam  Constitutional: She appears well-nourished. She does not appear ill.  Neck: Neck supple.  Cardiovascular: Normal rate and regular rhythm.   Pulmonary/Chest: Effort normal and breath sounds normal.  Abdominal: Soft. Bowel sounds are normal. There is no tenderness. There is no CVA tenderness.  Skin: Skin is warm and dry.          Assessment & Plan:

## 2016-11-13 NOTE — Progress Notes (Signed)
Pre visit review using our clinic review tool, if applicable. No additional management support is needed unless otherwise documented below in the visit note. 

## 2016-11-13 NOTE — Patient Instructions (Signed)
Complete lab work prior to leaving today. I will notify you of your results once received.   Try taking dicyclomine for abdominal pain. Take 1 capsule by mouth before meals and at bedtime.  Please message me in 1 week if no improvement on dicyclomine.  Stop by the front desk and speak with Shirlee LimerickMarion regarding your referral to GI.  It was a pleasure to see you today!

## 2016-11-19 ENCOUNTER — Other Ambulatory Visit: Payer: Self-pay | Admitting: Primary Care

## 2016-11-19 DIAGNOSIS — F411 Generalized anxiety disorder: Secondary | ICD-10-CM

## 2016-11-22 ENCOUNTER — Telehealth: Payer: Self-pay

## 2016-11-22 ENCOUNTER — Other Ambulatory Visit: Payer: Self-pay

## 2016-11-22 DIAGNOSIS — R112 Nausea with vomiting, unspecified: Secondary | ICD-10-CM

## 2016-11-22 NOTE — Telephone Encounter (Signed)
Contacted Central Scheduling about gastric emptying study. LVM. Waiting for callback.

## 2016-11-22 NOTE — Telephone Encounter (Signed)
-----   Message from Wyline MoodKiran Anna, MD sent at 11/22/2016 10:17 AM EST ----- Regarding: RE: Meds Not Working Try capsules three times a day  Get gastric emptying study which we did order  kiran  ----- Message ----- From: Ethlyn GalleryPanya Nemiah Kissner, CMA Sent: 11/21/2016   2:03 PM To: Wyline MoodKiran Anna, MD Subject: Meds Not Working                               Pt tried IB Guard (green) pt states she's able to eat a little but nausea returns.  She would like to know what are her next steps.

## 2016-11-24 ENCOUNTER — Encounter: Payer: Self-pay | Admitting: Primary Care

## 2016-11-25 ENCOUNTER — Other Ambulatory Visit: Payer: Self-pay

## 2016-11-25 ENCOUNTER — Telehealth: Payer: Self-pay

## 2016-11-25 ENCOUNTER — Other Ambulatory Visit: Payer: Self-pay | Admitting: Gastroenterology

## 2016-11-25 ENCOUNTER — Other Ambulatory Visit
Admission: RE | Admit: 2016-11-25 | Discharge: 2016-11-25 | Disposition: A | Discharge status: Home / Self Care | Payer: BLUE CROSS/BLUE SHIELD | Source: Ambulatory Visit | Attending: Gastroenterology | Admitting: Gastroenterology

## 2016-11-25 DIAGNOSIS — R112 Nausea with vomiting, unspecified: Secondary | ICD-10-CM | POA: Insufficient documentation

## 2016-11-25 LAB — C-REACTIVE PROTEIN: CRP: <0.8 mg/dL (ref <1.0)

## 2016-11-25 NOTE — Telephone Encounter (Signed)
[facility] Pt has been advised.

## 2016-12-16 ENCOUNTER — Encounter
Admission: RE | Admit: 2016-12-16 | Discharge: 2016-12-16 | Disposition: A | Discharge status: Home / Self Care | Payer: BLUE CROSS/BLUE SHIELD | Source: Ambulatory Visit | Attending: Gastroenterology | Admitting: Gastroenterology

## 2016-12-16 DIAGNOSIS — R112 Nausea with vomiting, unspecified: Secondary | ICD-10-CM

## 2016-12-16 MED ORDER — TECHNETIUM TC 99M SULFUR COLLOID
2.9000 | Freq: Once | INTRAVENOUS | Status: AC | PRN
  Administered 2016-12-16: 2.9 via ORAL

## 2016-12-17 ENCOUNTER — Telehealth: Payer: Self-pay

## 2016-12-17 ENCOUNTER — Other Ambulatory Visit: Payer: Self-pay

## 2016-12-17 DIAGNOSIS — Z1211 Encounter for screening for malignant neoplasm of colon: Secondary | ICD-10-CM

## 2016-12-17 NOTE — Telephone Encounter (Signed)
Gastroenterology Pre-Procedure Review  Request Date: 12/31/16 Requesting Physician: Dr. Tobi BastosAnna  PATIENT REVIEW QUESTIONS: The patient responded to the following health history questions as indicated:    1. Are you having any GI issues? yes (abd pain) 2. Do you have a personal history of Polyps? no 3. Do you have a family history of Colon Cancer or Polyps? yes (grandfather: colon cancer) 4. Diabetes Mellitus? no 5. Joint replacements in the past 12 months?no 6. Major health problems in the past 3 months?no 7. Any artificial heart valves, MVP, or defibrillator?no    MEDICATIONS & ALLERGIES:    Patient reports the following regarding taking any anticoagulation/antiplatelet therapy:   Plavix, Coumadin, Eliquis, Xarelto, Lovenox, Pradaxa, Brilinta, or Effient? no Aspirin? no  Patient confirms/reports the following medications:  Current Outpatient Prescriptions  Medication Sig Dispense Refill  . dicyclomine (BENTYL) 10 MG capsule Take 1 capsule (10 mg total) by mouth 4 (four) times daily -  before meals and at bedtime. 40 capsule 1  . hydrOXYzine (ATARAX/VISTARIL) 25 MG tablet Take 1 tablet (25 mg total) by mouth 2 (two) times daily as needed for anxiety. 60 tablet 0  . Norgestimate-Ethinyl Estradiol Triphasic (TRI-LO-MARZIA) 0.18/0.215/0.25 MG-25 MCG tab Take by mouth.    Marland Kitchen. omeprazole (PRILOSEC) 20 MG capsule Take by mouth.    . promethazine (PHENERGAN) 25 MG tablet Take 25 mg by mouth every 6 (six) hours as needed for nausea or vomiting.     No current facility-administered medications for this visit.     Patient confirms/reports the following allergies:  No Known Allergies  No orders of the defined types were placed in this encounter.   AUTHORIZATION INFORMATION Primary Insurance: 1D#: Group #:  Secondary Insurance: 1D#: Group #:  SCHEDULE INFORMATION: Date: 12/31/16 Time: Location:[facility]

## 2016-12-17 NOTE — Telephone Encounter (Signed)
-----   Message from Wyline MoodKiran Anna, MD sent at 12/17/2016 11:17 AM EDT ----- Normal

## 2017-01-01 ENCOUNTER — Encounter: Payer: Self-pay | Admitting: *Deleted

## 2017-01-02 ENCOUNTER — Ambulatory Visit
Admission: RE | Admit: 2017-01-02 | Discharge: 2017-01-02 | Disposition: A | Discharge status: Home / Self Care | Payer: BLUE CROSS/BLUE SHIELD | Source: Ambulatory Visit | Attending: Gastroenterology | Admitting: Gastroenterology

## 2017-01-02 ENCOUNTER — Ambulatory Visit: Payer: BLUE CROSS/BLUE SHIELD | Admitting: *Deleted

## 2017-01-02 ENCOUNTER — Encounter
Admission: RE | Disposition: A | Discharge status: Home / Self Care | Payer: Self-pay | Source: Ambulatory Visit | Attending: Gastroenterology

## 2017-01-02 ENCOUNTER — Encounter: Payer: Self-pay | Admitting: *Deleted

## 2017-01-02 DIAGNOSIS — K219 Gastro-esophageal reflux disease without esophagitis: Secondary | ICD-10-CM | POA: Insufficient documentation

## 2017-01-02 DIAGNOSIS — Z79899 Other long term (current) drug therapy: Secondary | ICD-10-CM | POA: Diagnosis not present

## 2017-01-02 DIAGNOSIS — Z87891 Personal history of nicotine dependence: Secondary | ICD-10-CM | POA: Insufficient documentation

## 2017-01-02 DIAGNOSIS — Z793 Long term (current) use of hormonal contraceptives: Secondary | ICD-10-CM | POA: Diagnosis not present

## 2017-01-02 DIAGNOSIS — R109 Unspecified abdominal pain: Secondary | ICD-10-CM | POA: Insufficient documentation

## 2017-01-02 DIAGNOSIS — Z1211 Encounter for screening for malignant neoplasm of colon: Secondary | ICD-10-CM

## 2017-01-02 HISTORY — PX: COLONOSCOPY WITH PROPOFOL: SHX5780

## 2017-01-02 LAB — POCT PREGNANCY, URINE: Preg Test, Ur: NEGATIVE

## 2017-01-02 SURGERY — COLONOSCOPY WITH PROPOFOL
Anesthesia: General

## 2017-01-02 MED ORDER — PROPOFOL 500 MG/50ML IV EMUL
INTRAVENOUS | Status: AC
  Filled 2017-01-02: qty 50

## 2017-01-02 MED ORDER — DEXAMETHASONE SODIUM PHOSPHATE 10 MG/ML IJ SOLN
INTRAMUSCULAR | Status: AC
  Filled 2017-01-02: qty 1

## 2017-01-02 MED ORDER — GLYCOPYRROLATE 0.2 MG/ML IJ SOLN
INTRAMUSCULAR | Status: AC
  Filled 2017-01-02: qty 1

## 2017-01-02 MED ORDER — ONDANSETRON HCL 4 MG/2ML IJ SOLN
INTRAMUSCULAR | Status: AC
  Filled 2017-01-02: qty 2

## 2017-01-02 MED ORDER — SUCCINYLCHOLINE CHLORIDE 20 MG/ML IJ SOLN
INTRAMUSCULAR | Status: AC
  Filled 2017-01-02: qty 1

## 2017-01-02 MED ORDER — SODIUM CHLORIDE 0.9 % IV SOLN
INTRAVENOUS | Status: DC
  Administered 2017-01-02: 11:00:00 via INTRAVENOUS

## 2017-01-02 MED ORDER — PROPOFOL 500 MG/50ML IV EMUL
INTRAVENOUS | Status: DC | PRN
  Administered 2017-01-02: 100 ug/kg/min via INTRAVENOUS

## 2017-01-02 MED ORDER — ROCURONIUM BROMIDE 50 MG/5ML IV SOLN
INTRAVENOUS | Status: AC
  Filled 2017-01-02: qty 1

## 2017-01-02 MED ORDER — PHENYLEPHRINE HCL 10 MG/ML IJ SOLN
INTRAMUSCULAR | Status: AC
  Filled 2017-01-02: qty 1

## 2017-01-02 NOTE — Anesthesia Post-op Follow-up Note (Cosign Needed)
Anesthesia QCDR form completed.        

## 2017-01-02 NOTE — Anesthesia Postprocedure Evaluation (Signed)
Anesthesia Post Note  Patient: Margaret Hayden  Procedure(s) Performed: Procedure(s) (LRB): COLONOSCOPY WITH PROPOFOL (N/A)  Patient location during evaluation: Endoscopy Anesthesia Type: General Level of consciousness: awake and alert Pain management: pain level controlled Vital Signs Assessment: post-procedure vital signs reviewed and stable Respiratory status: spontaneous breathing, nonlabored ventilation, respiratory function stable and patient connected to nasal cannula oxygen Cardiovascular status: blood pressure returned to baseline and stable Postop Assessment: no signs of nausea or vomiting Anesthetic complications: no     Last Vitals:  Vitals:   01/02/17 1120 01/02/17 1130  BP: (!) 95/59 106/67  Pulse: 74 66  Resp: 15 (!) 21  Temp:      Last Pain:  Vitals:   01/02/17 1110  TempSrc: Tympanic                 Lenard Simmer

## 2017-01-02 NOTE — Op Note (Signed)
[facility] Patient Name: Margaret Hayden Procedure Date: 01/02/2017 10:48 AM MRN: [id number] Account #: [id number] Date of Birth: [birth year] Admit Type: Outpatient Age: 36 Room: [facility] ENDO ROOM 4 Gender: Female Note Status: Finalized Procedure:            Colonoscopy Indications:          Abdominal pain Providers:            Wyline Mood MD, MD Referring MD:         Doreene Nest (Referring MD) Medicines:            Monitored Anesthesia Care Complications:        No immediate complications. Procedure:            Pre-Anesthesia Assessment:                       - Prior to the procedure, a History and Physical was                        performed, and patient medications, allergies and                        sensitivities were reviewed. The patient's tolerance of                        previous anesthesia was reviewed.                       - The risks and benefits of the procedure and the                        sedation options and risks were discussed with the                        patient. All questions were answered and informed                        consent was obtained.                       - ASA Grade Assessment: II - A patient with mild                        systemic disease.                       After obtaining informed consent, the colonoscope was                        passed under direct vision. Throughout the procedure,                        the patient's blood pressure, pulse, and oxygen                        saturations were monitored continuously. The                        Colonoscope was introduced through the anus and                        advanced to  the the terminal ileum. The colonoscopy was                        performed with ease. The patient tolerated the                        procedure well. The quality of the bowel preparation                        was excellent. Findings:      The perianal and digital  rectal examinations were normal.      The exam was otherwise without abnormality on direct and retroflexion       views.      The terminal ileum appeared normal. Impression:           - The examination was otherwise normal on direct and                        retroflexion views.                       - The examined portion of the ileum was normal.                       - No specimens collected. Recommendation:       - Discharge patient to home (with escort).                       - Advance diet as tolerated.                       - Continue present medications.                       - Return to my office in 4 weeks. Procedure Code(s):    --- Professional ---                       [phone], Colonoscopy, flexible; diagnostic, including                        collection of specimen(s) by brushing or washing, when                        performed (separate procedure) Diagnosis Code(s):    --- Professional ---                       R10.9, Unspecified abdominal pain CPT copyright 2016 American Medical Association. All rights reserved. The codes documented in this report are preliminary and upon coder review may  be revised to meet current compliance requirements. Wyline Mood, MD Wyline Mood MD, MD 01/02/2017 11:11:51 AM This report has been signed electronically. Number of Addenda: 0 Note Initiated On: 01/02/2017 10:48 AM Scope Withdrawal Time: 0 hours 12 minutes 15 seconds  Total Procedure Duration: 0 hours 15 minutes 47 seconds       [facility]

## 2017-01-02 NOTE — H&P (Signed)
  Margaret Mood MD [address] [place], [place] [address] Phone: [phone] Fax : [phone]  Primary Care Physician:  Margaret Sheldon, NP Primary Gastroenterologist:  Dr. Wyline Hayden   Pre-Procedure History & Physical: HPI:  Margaret Hayden is a 36 y.o. female is here for an colonoscopy.   Past Medical History:  Diagnosis Date  . GERD (gastroesophageal reflux disease)   . Kidney stone     Past Surgical History:  Procedure Laterality Date  . NO PAST SURGERIES      Prior to Admission medications   Medication Sig Start Date End Date Taking? Authorizing Provider  dicyclomine (BENTYL) 10 MG capsule Take 1 capsule (10 mg total) by mouth 4 (four) times daily -  before meals and at bedtime. 11/13/16   Margaret Nest, NP  hydrOXYzine (ATARAX/VISTARIL) 25 MG tablet Take 1 tablet (25 mg total) by mouth 2 (two) times daily as needed for anxiety. 10/23/16   Margaret Nest, NP  Norgestimate-Ethinyl Estradiol Triphasic (TRI-LO-MARZIA) 0.18/0.215/0.25 MG-25 MCG tab Take by mouth. 07/26/16   Historical Provider, MD  omeprazole (PRILOSEC) 20 MG capsule Take by mouth.    Historical Provider, MD  promethazine (PHENERGAN) 25 MG tablet Take 25 mg by mouth every 6 (six) hours as needed for nausea or vomiting.    Historical Provider, MD    Allergies as of 12/17/2016  . (No Known Allergies)    Family History  Problem Relation Age of Onset  . Gallbladder disease Mother   . Diabetes Mother   . Gallbladder disease Father   . Appendicitis Father   . Breast cancer Maternal Grandmother   . Colon cancer Paternal Grandfather   . Breast cancer Maternal Aunt     Social History   Social History  . Marital status: Married    Spouse name: N/A  . Number of children: N/A  . Years of education: N/A   Occupational History  . Not on file.   Social History Main Topics  . Smoking status: Never Smoker  . Smokeless tobacco: Never Used  . Alcohol use No  . Drug use: No  .  Sexual activity: Yes   Other Topics Concern  . Not on file   Social History Narrative   Married.   2 children.   Works as a Futures trader.   Enjoys reading, drawing/painting, spending time with family.    Review of Systems: See HPI, otherwise negative ROS  Physical Exam: BP 104/67   Pulse 80   Temp 97.3 F (36.3 C) (Oral)   Resp 16   Ht  (1.626 m)   Wt 137 lb (62.1 kg)   LMP 12/29/2016 (Exact Date)   SpO2 100%   BMI 23.52 kg/m  General:   Alert,  pleasant and cooperative in NAD Head:  Normocephalic and atraumatic. Neck:  Supple; no masses or thyromegaly. Lungs:  Clear throughout to auscultation.    Heart:  Regular rate and rhythm. Abdomen:  Soft, nontender and nondistended. Normal bowel sounds, without guarding, and without rebound.   Neurologic:  Alert and  oriented x4;  grossly normal neurologically.  Impression/Plan: MCKENZI BUONOMO is here for an colonoscopy to be performed for abdominal pain   Risks, benefits, limitations, and alternatives regarding  colonoscopy have been reviewed with the patient.  Questions have been answered.  All parties agreeable.   Margaret Mood, MD  01/02/2017, 10:17 AM

## 2017-01-02 NOTE — Transfer of Care (Signed)
Immediate Anesthesia Transfer of Care Note  Patient: Margaret Hayden  Procedure(s) Performed: Procedure(s): COLONOSCOPY WITH PROPOFOL (N/A)  Patient Location: PACU  Anesthesia Type:General  Level of Consciousness: awake, alert  and oriented  Airway & Oxygen Therapy: Patient Spontanous Breathing and Patient connected to nasal cannula oxygen  Post-op Assessment: Report given to RN and Post -op Vital signs reviewed and stable  Post vital signs: Reviewed and stable  Last Vitals:  Vitals:   01/02/17 1013  BP: 104/67  Pulse: 80  Resp: 16  Temp: 36.3 C    Last Pain:  Vitals:   01/02/17 1013  TempSrc: Oral         Complications: No apparent anesthesia complications

## 2017-01-02 NOTE — Anesthesia Preprocedure Evaluation (Signed)
Anesthesia Evaluation  Patient identified by MRN, date of birth, ID band Patient awake    Reviewed: Allergy & Precautions, H&P , NPO status , Patient's Chart, lab work & pertinent test results, reviewed documented beta blocker date and time   History of Anesthesia Complications Negative for: history of anesthetic complications  Airway Mallampati: I  TM Distance: >3 FB Neck ROM: full    Dental  (+) Teeth Intact   Pulmonary neg pulmonary ROS, former smoker,           Cardiovascular Exercise Tolerance: Good negative cardio ROS       Neuro/Psych PSYCHIATRIC DISORDERS (Anxiety) negative neurological ROS     GI/Hepatic Neg liver ROS, GERD  ,  Endo/Other  negative endocrine ROS  Renal/GU Renal disease (stones)  negative genitourinary   Musculoskeletal   Abdominal   Peds  Hematology negative hematology ROS (+)   Anesthesia Other Findings Past Medical History: No date: GERD (gastroesophageal reflux disease) No date: Kidney stone     Comment: cyst on kidney right on scan about 1 month ago   Reproductive/Obstetrics negative OB ROS                             Anesthesia Physical Anesthesia Plan  ASA: II  Anesthesia Plan: General   Post-op Pain Management:    Induction:   Airway Management Planned:   Additional Equipment:   Intra-op Plan:   Post-operative Plan:   Informed Consent: I have reviewed the patients History and Physical, chart, labs and discussed the procedure including the risks, benefits and alternatives for the proposed anesthesia with the patient or authorized representative who has indicated his/her understanding and acceptance.   Dental Advisory Given  Plan Discussed with: Anesthesiologist, CRNA and Surgeon  Anesthesia Plan Comments:         Anesthesia Quick Evaluation

## 2017-01-03 ENCOUNTER — Encounter: Payer: Self-pay | Admitting: Gastroenterology

## 2017-01-13 ENCOUNTER — Telehealth: Payer: Self-pay | Admitting: Gastroenterology

## 2017-01-13 NOTE — Telephone Encounter (Signed)
Patient called and still struggling with nausea and abdominal pain. The nausea is the worst (0-10 she is between 7 and 10). Please call patient.

## 2017-01-14 ENCOUNTER — Other Ambulatory Visit: Payer: Self-pay

## 2017-01-14 ENCOUNTER — Encounter: Payer: Self-pay | Admitting: Gastroenterology

## 2017-01-14 ENCOUNTER — Ambulatory Visit (INDEPENDENT_AMBULATORY_CARE_PROVIDER_SITE_OTHER): Payer: BLUE CROSS/BLUE SHIELD | Admitting: Gastroenterology

## 2017-01-14 VITALS — BP 123/80 | HR 75 | Temp 98.3°F | Ht 64.5 in | Wt 139.2 lb

## 2017-01-14 DIAGNOSIS — R110 Nausea: Secondary | ICD-10-CM

## 2017-01-14 MED ORDER — AMITRIPTYLINE HCL 10 MG PO TABS: 10.0000 mg | ORAL_TABLET | Freq: Every day | ORAL | 0 refills | Status: DC

## 2017-01-14 MED ORDER — PROMETHAZINE HCL 25 MG PO TABS
25.0000 mg | ORAL_TABLET | Freq: Four times a day (QID) | ORAL | 0 refills | Status: DC | PRN
Start: 2017-01-14 — End: 2017-04-18

## 2017-01-14 NOTE — Progress Notes (Signed)
Primary Care Physician: Morrie Sheldon, NP  Primary Gastroenterologist:  Dr. Wyline Mood   Chief Complaint  Patient presents with  . Follow-up    HPI: Margaret Hayden is a 36 y.o. female. She was initially referred in 10/2016 for RUQ abdominal , nausea pain for the past few months , after she eats , of several hours in duration .   Summary of history :  She has previously been a patient of the [place] [facility] . She is here for a second opinion.  Prior evaluation has been a normal EGD in 2017 , normal ultrasound, HIDA scan with EF of 46 %. Concern of weight loss of >100 lbs . H pylori breath test, LFT's  negative in 07/2016 .celiac serology has also been negative in the past  There has been concern that it may have been due to stress . It appears she was a patient of Dr Christella Hartigan in the past , then switched to Dr Ewing Schlein and then Dr Markham Jordan. Since 05/2016 lost 22 lbs per [facility] records.    Interval history 10/2016-12/2016 Normal gastric emptying study  CRP normal  Colonoscopy was normal   Nausea still present, abdominal pain is much better.   The peppermint oil tablets helps sometimes.   Main issue presently is nausea.   Still takes her PPI which helps a bit. Not taking her zoloft or birth control. Occasional headaches, no issues with balance,vision, hearing.  Weight stable since last visit   BP 123/80 (BP Location: Left Arm, Patient Position: Sitting, Cuff Size: Normal)   Pulse 75   Temp 98.3 F (36.8 C) (Oral)   Ht 5' 4.5" (1.638 m)   Wt 139 lb 3.2 oz (63.1 kg)   LMP 12/29/2016 (Exact Date)   BMI 23.52 kg/m    Current Outpatient Prescriptions  Medication Sig Dispense Refill  . hydrOXYzine (ATARAX/VISTARIL) 25 MG tablet Take 1 tablet (25 mg total) by mouth 2 (two) times daily as needed for anxiety. 60 tablet 0  . Norgestimate-Ethinyl Estradiol Triphasic (TRI-LO-MARZIA) 0.18/0.215/0.25 MG-25 MCG tab Take by mouth.    Marland Kitchen omeprazole (PRILOSEC) 20 MG capsule Take by mouth.     . promethazine (PHENERGAN) 25 MG tablet Take 25 mg by mouth every 6 (six) hours as needed for nausea or vomiting.    . dicyclomine (BENTYL) 10 MG capsule Take 1 capsule (10 mg total) by mouth 4 (four) times daily -  before meals and at bedtime. (Patient not taking: Reported on 01/02/2017) 40 capsule 1  . sertraline (ZOLOFT) 50 MG tablet TAKE 1 TABLET (50 MG TOTAL) BY MOUTH DAILY.  1   No current facility-administered medications for this visit.     Allergies as of 01/14/2017  . (No Known Allergies)    ROS:  General: Negative for anorexia, weight loss, fever, chills, fatigue, weakness. ENT: Negative for hoarseness, difficulty swallowing , nasal congestion. CV: Negative for chest pain, angina, palpitations, dyspnea on exertion, peripheral edema.  Respiratory: Negative for dyspnea at rest, dyspnea on exertion, cough, sputum, wheezing.  GI: See history of present illness. GU:  Negative for dysuria, hematuria, urinary incontinence, urinary frequency, nocturnal urination.  Endo: Negative for unusual weight change.    Physical Examination:   BP 123/80 (BP Location: Left Arm, Patient Position: Sitting, Cuff Size: Normal)   Pulse 75   Temp 98.3 F (36.8 C) (Oral)   Ht 5' 4.5" (1.638 m)   Wt 139 lb 3.2 oz (63.1 kg)   LMP 12/29/2016 (Exact Date)   BMI  23.52 kg/m   General: Well-nourished, well-developed in no acute distress.  Eyes: No icterus. Conjunctivae pink. Mouth: Oropharyngeal mucosa moist and pink , no lesions erythema or exudate. Lungs: Clear to auscultation bilaterally. Non-labored. Heart: Regular rate and rhythm, no murmurs rubs or gallops.  Abdomen: Bowel sounds are normal, nontender, nondistended, no hepatosplenomegaly or masses, no abdominal bruits or hernia , no rebound or guarding.   Extremities: No lower extremity edema. No clubbing or deformities. Neuro: Alert and oriented x 3.  Grossly intact. Skin: Warm and dry, no jaundice.   Psych: Alert and cooperative, normal  mood and affect.  Imaging Studies: Nm Gastric Emptying  Result Date: 12/16/2016 CLINICAL DATA:  Nausea and RIGHT upper abdominal pain for 7-8 months, pain starting after eating, some vomiting, history GERD EXAM: NUCLEAR MEDICINE GASTRIC EMPTYING SCAN TECHNIQUE: After oral ingestion of radiolabeled meal, sequential abdominal images were obtained for 4 hours. Percentage of activity emptying the stomach was calculated at 1 hour, 2 hour, 3 hour, and 4 hours. RADIOPHARMACEUTICALS:  2.9 mCi Tc-40m sulfur colloid in standardized meal COMPARISON:  None FINDINGS: Expected location of the stomach in the left upper quadrant. Ingested meal empties the stomach gradually over the course of the study. 34% emptied at 1 hr ( normal >= 10%) 77% emptied at 2 hr ( normal >= 40%) 92% emptied at 3 hr ( normal >= 70%) 96% emptied at 4 hr ( normal >= 90%) IMPRESSION: Normal gastric emptying study. Electronically Signed   By: Ulyses Southward M.D.   On: 12/16/2016 14:24    Assessment and Plan:   Margaret Hayden is a 36 y.o. y/o female here  for long standing dyspeptic symptoms. Her RUQ pain is post prandial , strong family history of gall stones. Prior imaging showed no gall stones. I explained very rarely sludge can still be present which can cause pain . Provided options of endoscopic ultrasound to evaluate for Gall bladder sludge , trial of Elavil for functional dyspepsia and consider MRI brain to evaluate for central nausea. She decided to start with amitryptaline and if no better will consider the other options.    Dr Wyline Mood  MD Follow up in 8 weeks

## 2017-02-06 ENCOUNTER — Ambulatory Visit: Payer: BLUE CROSS/BLUE SHIELD | Admitting: Gastroenterology

## 2017-02-11 ENCOUNTER — Other Ambulatory Visit: Payer: Self-pay | Admitting: Gastroenterology

## 2017-03-10 ENCOUNTER — Ambulatory Visit (INDEPENDENT_AMBULATORY_CARE_PROVIDER_SITE_OTHER): Payer: BLUE CROSS/BLUE SHIELD | Admitting: Gastroenterology

## 2017-03-10 VITALS — BP 120/76 | HR 78 | Temp 98.3°F | Ht 64.0 in | Wt 139.0 lb

## 2017-03-10 DIAGNOSIS — R1013 Epigastric pain: Secondary | ICD-10-CM

## 2017-03-10 NOTE — Progress Notes (Signed)
Wyline MoodKiran Mattia Liford MD, MRCP(U.K) [address]  [address]  [place], [place] [address]  Main: [phone]  Fax: [phone]   Primary Care Physician: Doreene Nestlark, Katherine K, NP  Primary Gastroenterologist:  Dr. Wyline MoodKiran Janila Arrazola   Chief Complaint  Patient presents with  . Follow-up  . Nausea    has gotten worse especially after the first week of peppermint oil  . right upper quadrant pain    pain radiates mid back and more frequent    HPI: Margaret Hayden is a 36 y.o. female   She is here today for a follow up visit.    Summary of history : She was initially seen in 10/2016 referred for RUQ pain.  She has previously been a patient of the [facility] . She is here for a second opinion.  Prior evaluation has been a normal EGD in 2017 , normal ultrasound, HIDA scan with EF of 46 %. Concern of weight loss of >100 lbs . H pylori breath test, LFT's  negative in 07/2016 .celiac serology has also been negative in the past  There has been concern that it may have been due to stress . It appears she was a patient of Dr Christella HartiganJacobs in the past , then switched to Dr Ewing SchleinMagod and then Dr Markham JordanElliot. Abdominal pain RUQ was within 30 mins of a meal , sharp in nature. She has also had nausea since sept 2017 , usually in the afternoon , worse after meals.     Interval history   11/13/2016-  03/10/2017   No weight loss since last visit     CRP-normal  Normal gastric emptying study.  Normal colonoscopy 12/2016  Says right after meals feels nauseous and then develops abdominal pain .  Tried bentyl, peppermint oil-neither work. We also tried amitryptaline which did not work.      Current Outpatient Prescriptions  Medication Sig Dispense Refill  . amitriptyline (ELAVIL) 10 MG tablet Take 1 tablet (10 mg total) by mouth at bedtime. 61 tablet 0  . dicyclomine (BENTYL) 10 MG capsule Take 1 capsule (10 mg total) by mouth 4 (four) times daily -  before meals and at bedtime. 40 capsule 1  . hydrOXYzine  (ATARAX/VISTARIL) 25 MG tablet Take 1 tablet (25 mg total) by mouth 2 (two) times daily as needed for anxiety. 60 tablet 0  . Norgestimate-Ethinyl Estradiol Triphasic (TRI-LO-MARZIA) 0.18/0.215/0.25 MG-25 MCG tab Take by mouth.    Marland Kitchen. omeprazole (PRILOSEC) 20 MG capsule Take by mouth.    . promethazine (PHENERGAN) 25 MG tablet Take 1 tablet (25 mg total) by mouth every 6 (six) hours as needed for nausea or vomiting. 30 tablet 0   No current facility-administered medications for this visit.     Allergies as of 03/10/2017  . (No Known Allergies)    ROS:  General: Negative for anorexia, weight loss, fever, chills, fatigue, weakness. ENT: Negative for hoarseness, difficulty swallowing , nasal congestion. CV: Negative for chest pain, angina, palpitations, dyspnea on exertion, peripheral edema.  Respiratory: Negative for dyspnea at rest, dyspnea on exertion, cough, sputum, wheezing.  GI: See history of present illness. GU:  Negative for dysuria, hematuria, urinary incontinence, urinary frequency, nocturnal urination.  Endo: Negative for unusual weight change.    Physical Examination:   BP 120/76   Pulse 78   Temp 98.3 F (36.8 C) (Oral)   Ht 5\' 4"  (1.626 m)   Wt 139 lb (63 kg)   BMI 23.86 kg/m   General: Well-nourished, well-developed  in no acute distress.  Eyes: No icterus. Conjunctivae pink. Mouth: Oropharyngeal mucosa moist and pink , no lesions erythema or exudate. Lungs: Clear to auscultation bilaterally. Non-labored. Heart: Regular rate and rhythm, no murmurs rubs or gallops.  Abdomen: Bowel sounds are normal, nontender, nondistended, no hepatosplenomegaly or masses, no abdominal bruits or hernia , no rebound or guarding.   Extremities: No lower extremity edema. No clubbing or deformities. Neuro: Alert and oriented x 3.  Grossly intact. Skin: Warm and dry, no jaundice.   Psych: Alert and cooperative, normal mood and affect.   Imaging Studies: No results  found.  Assessment and Plan:   Margaret Hayden is a 36 y.o. y/o female here to follow up for long standing dyspeptic symptoms. Her RUQ pain is post prandial , strong family history of gall stones. Prior imaging showed no gall stones. I explained very rarely sludge can still be present which can cause pain .  Plan   1. EUS to evaluate gall bladder to evaluate for sludge or gall stones , if negative will repeat HIDA scan and if still negative explained very likely its functional in etiology.   I have discussed alternative options, risks & benefits,  which include, but are not limited to, bleeding, infection, perforation,respiratory complication & drug reaction.  The patient agrees with this plan & written consent will be obtained.     Dr Wyline Mood  MD,MRCP [facility]) Follow up in 8-10 weeks

## 2017-03-11 ENCOUNTER — Other Ambulatory Visit: Payer: Self-pay

## 2017-03-11 DIAGNOSIS — G8929 Other chronic pain: Secondary | ICD-10-CM

## 2017-03-11 DIAGNOSIS — R1013 Epigastric pain: Principal | ICD-10-CM

## 2017-03-12 ENCOUNTER — Other Ambulatory Visit: Payer: Self-pay

## 2017-03-12 DIAGNOSIS — R1013 Epigastric pain: Secondary | ICD-10-CM

## 2017-03-14 ENCOUNTER — Telehealth: Payer: Self-pay

## 2017-03-14 NOTE — Telephone Encounter (Signed)
Advised patient of US Abd complete.  [facility] @ 915am.  NPO after midnight

## 2017-03-17 ENCOUNTER — Other Ambulatory Visit: Payer: Self-pay

## 2017-03-20 ENCOUNTER — Ambulatory Visit
Admission: RE | Admit: 2017-03-20 | Discharge: 2017-03-20 | Disposition: A | Discharge status: Home / Self Care | Payer: BLUE CROSS/BLUE SHIELD | Source: Ambulatory Visit | Attending: Gastroenterology | Admitting: Gastroenterology

## 2017-03-20 DIAGNOSIS — R1013 Epigastric pain: Secondary | ICD-10-CM | POA: Insufficient documentation

## 2017-03-24 ENCOUNTER — Telehealth: Payer: Self-pay | Admitting: Gastroenterology

## 2017-03-24 ENCOUNTER — Other Ambulatory Visit: Payer: Self-pay

## 2017-03-24 DIAGNOSIS — R1013 Epigastric pain: Principal | ICD-10-CM

## 2017-03-24 DIAGNOSIS — G8929 Other chronic pain: Secondary | ICD-10-CM

## 2017-03-24 NOTE — Telephone Encounter (Signed)
Patient called wanting ultrasound results. 

## 2017-03-24 NOTE — Telephone Encounter (Signed)
Advised patient of results per Dr. Tobi BastosAnna.   Can we infrorm patient that Dr Christella HartiganJacobs not keen on EUS. Check with her if still wishes to pursue which we can try with [facility].  Patient would like to have the repeat HIDA scan.  Scheduled for July 12th @ [place] @ 715am. NPO midnight.

## 2017-04-01 ENCOUNTER — Encounter: Payer: Self-pay | Admitting: Gastroenterology

## 2017-04-03 ENCOUNTER — Encounter
Admission: RE | Admit: 2017-04-03 | Discharge: 2017-04-03 | Disposition: A | Discharge status: Home / Self Care | Payer: BLUE CROSS/BLUE SHIELD | Source: Ambulatory Visit | Attending: Gastroenterology | Admitting: Gastroenterology

## 2017-04-03 DIAGNOSIS — G8929 Other chronic pain: Secondary | ICD-10-CM | POA: Diagnosis present

## 2017-04-03 DIAGNOSIS — R1013 Epigastric pain: Secondary | ICD-10-CM | POA: Insufficient documentation

## 2017-04-03 MED ORDER — TECHNETIUM TC 99M MEBROFENIN IV KIT
5.0000 | PACK | Freq: Once | INTRAVENOUS | Status: AC | PRN
  Administered 2017-04-03: 5.3 via INTRAVENOUS

## 2017-04-04 ENCOUNTER — Telehealth: Payer: Self-pay

## 2017-04-04 NOTE — Telephone Encounter (Signed)
-----   Message from Kiran Anna, MD sent at 04/03/2017  2:19 PM EDT ----- Normal HIDA scan 

## 2017-04-04 NOTE — Telephone Encounter (Signed)
-----   Message from Wyline MoodKiran Anna, MD sent at 04/03/2017  2:19 PM EDT ----- Normal HIDA scan

## 2017-04-04 NOTE — Telephone Encounter (Signed)
Advised patient of results per Dr. Tobi BastosAnna.   Patient wants to know next course of action.   Advised pt I would send Dr. Tobi BastosAnna a message and callback with plan of action on Monday.

## 2017-04-06 ENCOUNTER — Other Ambulatory Visit: Payer: Self-pay | Admitting: Gastroenterology

## 2017-04-08 ENCOUNTER — Telehealth: Payer: Self-pay

## 2017-04-08 NOTE — Telephone Encounter (Signed)
Relayed message per Dr. Tobi BastosAnna.   My last option is to offer her option of referral to another GI physician to consider EUS to r/o gall stones in the gall bladder.  Pt requested that [facility].  Processing today.

## 2017-04-08 NOTE — Telephone Encounter (Signed)
  Oncology Nurse Navigator Documentation Received referral from Dr. Tobi BastosAnna for EUS. Voicemail left with Ms. Linhart for scheduling. Panya at [facility] notified of 05/29/17 being first availability. Navigator Location: [facility] (04/08/17 1400)   )Navigator Encounter Type: Telephone (04/08/17 1400) Telephone: Outgoing Call (04/08/17 1400)                       Barriers/Navigation Needs: Coordination of Care (04/08/17 1400)   Interventions: Coordination of Care (04/08/17 1400)   Coordination of Care: EUS (04/08/17 1400)                  Time Spent with Patient: 15 (04/08/17 1400)

## 2017-04-08 NOTE — Telephone Encounter (Signed)
-----   Message from Wyline MoodKiran Anna, MD sent at 04/06/2017  5:49 PM EDT ----- Regarding: RE: Plan of Action My last option is to offer her option of referra,l to another GI physician to consider EUS to r/o gall stones in the gall bladder  Kiran  ----- Message ----- From: Ethlyn Galleryarter, Mrytle Bento, CMA Sent: 04/04/2017  11:18 AM To: Wyline MoodKiran Anna, MD Subject: Plan of Action                                 [name] wants to know what she should do next.  Thanks   ----- Message ----- From: Wyline MoodAnna, Kiran, MD Sent: 04/03/2017   2:19 PM To: Ethlyn GalleryPanya Chancey Cullinane, CMA  Normal HIDA scan

## 2017-04-09 ENCOUNTER — Other Ambulatory Visit: Payer: Self-pay

## 2017-04-09 ENCOUNTER — Telehealth: Payer: Self-pay

## 2017-04-09 NOTE — Telephone Encounter (Signed)
  Oncology Nurse Navigator Documentation EUS scheduled for 05/29/17 with Dr. Chevis [facility]Spaete at [facility]. [place] instructions and copy mailed to home address. Denies diabetes or anticoagulants. INSTRUCTIONS FOR ENDOSCOPIC ULTRASOUND -Your procedure has been scheduled for September 6th with Dr. Chevis PrettySpaete at [facility]. -The hospital may contact you to pre-register over the phone.  -To get your scheduled arrival time, please call the Endoscopy unit at  [phone] between 1-3 p.m. on:  September 5th   -ON THE DAY OF YOU PROCEDURE:   1. If you are scheduled for a morning procedure, nothing to drink after midnight  -If you are scheduled for an afternoon procedure, you may have clear liquids until 5 hours prior  to the procedure but no carbonated drinks or broth  2. NO FOOD THE DAY OF YOUR PROCEDURE  3. You may take your heart, seizure, blood pressure, Parkinson's or breathing medications at  6am with just enough water to get your pills down  4. Do not take any oral Diabetic medications the morning of your procedure.  5. If you are a diabetic and are using insulin, please notify your prescribing physician of this  procedure as your dose may need to be altered related to not being able to eat or drink.   5. Do not take vitamins, iron, or fish oil for 5 days before your procedure     -On the day of your procedure, come to the [facility] Admitting/Registration desk (First desk on the right) at the scheduled arrival time. You MUST have someone drive you home from your procedure. You must have a responsible adult with a valid driver's license who is on site throughout your entire procedure and who can stay with you for several hours after your procedure. You may not go home alone in a taxi, shuttle [place] or bus, as the drivers will not be responsible for you.  --If you have any questions please call me at the above contact     Navigator Location: [facility] (04/09/17  1700)   )Navigator Encounter Type: Telephone (04/09/17 1700) Telephone: Kathrin Pennerutgoing Call (04/09/17 1700)                       Barriers/Navigation Needs: Coordination of Care (04/09/17 1700)   Interventions: Coordination of Care (04/09/17 1700)   Coordination of Care: EUS (04/09/17 1700)                  Time Spent with Patient: 30 (04/09/17 1700)

## 2017-04-18 ENCOUNTER — Other Ambulatory Visit: Payer: Self-pay | Admitting: Gastroenterology

## 2017-04-28 ENCOUNTER — Ambulatory Visit: Payer: BLUE CROSS/BLUE SHIELD | Admitting: Gastroenterology

## 2017-05-29 ENCOUNTER — Encounter: Payer: Self-pay | Admitting: *Deleted

## 2017-05-29 ENCOUNTER — Encounter
Admission: RE | Disposition: A | Discharge status: Home / Self Care | Payer: Self-pay | Source: Ambulatory Visit | Attending: Gastroenterology

## 2017-05-29 ENCOUNTER — Ambulatory Visit: Payer: BLUE CROSS/BLUE SHIELD | Admitting: Anesthesiology

## 2017-05-29 ENCOUNTER — Ambulatory Visit
Admission: RE | Admit: 2017-05-29 | Discharge: 2017-05-29 | Disposition: A | Discharge status: Home / Self Care | Payer: BLUE CROSS/BLUE SHIELD | Source: Ambulatory Visit | Attending: Gastroenterology | Admitting: Gastroenterology

## 2017-05-29 DIAGNOSIS — R1011 Right upper quadrant pain: Secondary | ICD-10-CM | POA: Insufficient documentation

## 2017-05-29 DIAGNOSIS — K449 Diaphragmatic hernia without obstruction or gangrene: Secondary | ICD-10-CM | POA: Diagnosis not present

## 2017-05-29 DIAGNOSIS — K219 Gastro-esophageal reflux disease without esophagitis: Secondary | ICD-10-CM | POA: Diagnosis not present

## 2017-05-29 DIAGNOSIS — K829 Disease of gallbladder, unspecified: Secondary | ICD-10-CM | POA: Insufficient documentation

## 2017-05-29 DIAGNOSIS — Z87442 Personal history of urinary calculi: Secondary | ICD-10-CM | POA: Insufficient documentation

## 2017-05-29 DIAGNOSIS — Z87891 Personal history of nicotine dependence: Secondary | ICD-10-CM | POA: Insufficient documentation

## 2017-05-29 HISTORY — PX: EUS: SHX5427

## 2017-05-29 LAB — POCT PREGNANCY, URINE: PREG TEST UR: NEGATIVE

## 2017-05-29 SURGERY — ULTRASOUND, UPPER GI TRACT, ENDOSCOPIC
Anesthesia: General

## 2017-05-29 MED ORDER — MIDAZOLAM HCL 2 MG/2ML IJ SOLN
INTRAMUSCULAR | Status: DC | PRN
  Administered 2017-05-29: 1 mg via INTRAVENOUS
  Administered 2017-05-29 (×2): 0.5 mg via INTRAVENOUS

## 2017-05-29 MED ORDER — GLYCOPYRROLATE 0.2 MG/ML IJ SOLN
INTRAMUSCULAR | Status: DC | PRN
  Administered 2017-05-29: 0.2 mg via INTRAVENOUS

## 2017-05-29 MED ORDER — LIDOCAINE HCL (PF) 2 % IJ SOLN
INTRAMUSCULAR | Status: AC
  Filled 2017-05-29: qty 4

## 2017-05-29 MED ORDER — SODIUM CHLORIDE 0.9 % IV SOLN
INTRAVENOUS | Status: DC
  Administered 2017-05-29: 1000 mL via INTRAVENOUS
  Administered 2017-05-29: 14:00:00 via INTRAVENOUS

## 2017-05-29 MED ORDER — PROPOFOL 500 MG/50ML IV EMUL
INTRAVENOUS | Status: DC | PRN
  Administered 2017-05-29: 160 ug/kg/min via INTRAVENOUS

## 2017-05-29 MED ORDER — PROPOFOL 10 MG/ML IV BOLUS
INTRAVENOUS | Status: AC
  Filled 2017-05-29: qty 20

## 2017-05-29 MED ORDER — PROPOFOL 10 MG/ML IV BOLUS
INTRAVENOUS | Status: DC | PRN
  Administered 2017-05-29: 30 mg via INTRAVENOUS
  Administered 2017-05-29: 20 mg via INTRAVENOUS
  Administered 2017-05-29: 70 mg via INTRAVENOUS

## 2017-05-29 MED ORDER — PROPOFOL 500 MG/50ML IV EMUL
INTRAVENOUS | Status: AC
  Filled 2017-05-29: qty 50

## 2017-05-29 MED ORDER — GLYCOPYRROLATE 0.2 MG/ML IJ SOLN
INTRAMUSCULAR | Status: AC
  Filled 2017-05-29: qty 1

## 2017-05-29 MED ORDER — LIDOCAINE HCL (CARDIAC) 20 MG/ML IV SOLN
INTRAVENOUS | Status: DC | PRN
  Administered 2017-05-29: 60 mg via INTRAVENOUS

## 2017-05-29 MED ORDER — MIDAZOLAM HCL 2 MG/2ML IJ SOLN
INTRAMUSCULAR | Status: AC
  Filled 2017-05-29: qty 2

## 2017-05-29 NOTE — Op Note (Signed)
[facility] Patient Name: Margaret Hayden Procedure Date: 05/29/2017 1:45 PM MRN: [id number] Account #: [id number] Date of Birth: August 08, 1981 Admit Type: Outpatient Age: 36 Room: [facility] ENDO ROOM 3 Gender: Female Note Status: Finalized Procedure:            Upper EUS Indications:          Abdominal pain in the right upper quadrant Patient Profile:      Refer to note in patient chart for documentation of                        history and physical. Providers:            Lenetta Quaker. Cephas Darby, MD Referring MD:         Pleas Koch (Referring MD), Jonathon Bellows MD, MD                        (Referring MD) Medicines:            Monitored Anesthesia Care Complications:        No immediate complications. Procedure:            Pre-Anesthesia Assessment:                       - Prior to the procedure, a History and Physical was                        performed, and patient medications and allergies were                        reviewed. The patient is competent. The risks and                        benefits of the procedure and the sedation options and                        risks were discussed with the patient. All questions                        were answered and informed consent was obtained.                        Patient identification and proposed procedure were                        verified by the physician in the pre-procedure area.                        Mental Status Examination: alert and oriented. Airway                        Examination: normal oropharyngeal airway and neck                        mobility. Respiratory Examination: clear to                        auscultation. CV Examination: normal. Prophylactic                        Antibiotics: The patient does not  require prophylactic                        antibiotics. Prior Anticoagulants: The patient has                        taken no previous anticoagulant or antiplatelet agents.                       ASA Grade Assessment: I - A normal, healthy patient.                        After reviewing the risks and benefits, the patient was                        deemed in satisfactory condition to undergo the                        procedure. The anesthesia plan was to use monitored                        anesthesia care (MAC). Immediately prior to                        administration of medications, the patient was                        re-assessed for adequacy to receive sedatives. The                        heart rate, respiratory rate, oxygen saturations, blood                        pressure, adequacy of pulmonary ventilation, and                        response to care were monitored throughout the                        procedure. The physical status of the patient was                        re-assessed after the procedure.                       After obtaining informed consent, the endoscope was                        passed under direct vision. Throughout the procedure,                        the patient's blood pressure, pulse, and oxygen                        saturations were monitored continuously. The EUS GI                        Linear Array [id number] was introduced through the mouth,                        and advanced to the duodenum for ultrasound  examination                        from the esophagus, stomach and duodenum. Findings:      Endoscopic Finding :      The examined esophagus was endoscopically normal.      A small hiatal hernia was present.      The entire examined stomach was endoscopically normal.      The examined duodenum was endoscopically normal.      Endosonographic Finding :      Two polypoid lesions favored to represent polyps were visualized       endosonographically in the gallbladder. The lesions measured up to 2.5       mm in greatest dimension. They were round. They were hyperechoic without       shadowing.      There was no sign  of significant endosonographic abnormality in the       common bile duct and in the common hepatic duct. The maximum diameter of       the CHD was 2.2 mm and CBD was 1.4 mm. No stones, no biliary sludge and       ducts of normal caliber were identified.      There was no sign of significant endosonographic abnormality in the       pancreatic head, in the genu of the pancreas, in the pancreatic body and       in the pancreatic tail. The pancreatic duct measured up to 1.2 mm in the       head, 0.9 mm in the neck and 1.0 mm in the body. It was not well       visualized within the tail. The pancreas was well visualized.      There was no sign of significant endosonographic abnormality in the left       lobe of the liver. No masses were identified.      No lymph nodes were visualized in the celiac region (level 20),       peripancreatic region and porta hepatis region. Impression:           EGD Impression:                       - Normal esophagus.                       - Small hiatal hernia.                       - Normal stomach.                       - Normal examined duodenum.                       EUS Impression:                       - Two small polypoid lesions were visualized                        endosonographically in the gallbladder these are                        favored to represent two gallbladder polyps. Small  stones are felt to be less likely.                       - There was no sign of significant pathology (sludge or                        stones) in the common bile duct and in the common                        hepatic duct.                       - There was no sign of significant pathology in the                        pancreatic head, in the genu of the pancreas, in the                        pancreatic body and in the pancreatic tail.                       - There was no evidence of significant pathology in the                        left lobe of  the liver.                       - No lymph nodes were visualized in the celiac region                        (level 20), peripancreatic region and porta hepatis                        region.                       - No specimens collected. Recommendation:       - Discharge patient to home (ambulatory).                       - Return to referring physician as previously scheduled.                       - The findings and recommendations were discussed with                        the patient and their family. Attending Participation:      I personally performed the entire procedure. Dr. Lenetta Quaker. Cephas Darby, MD Lenetta Quaker. Madyn Ivins, MD 05/29/2017 2:51:14 PM This report has been signed electronically. Number of Addenda: 0 Note Initiated On: 05/29/2017 1:45 PM Estimated Blood Loss: Estimated blood loss: none.      [facility]

## 2017-05-29 NOTE — Anesthesia Preprocedure Evaluation (Signed)
Anesthesia Evaluation  Patient identified by MRN, date of birth, ID band Patient awake    Reviewed: Allergy & Precautions  Airway Mallampati: II       Dental  (+) Teeth Intact   Pulmonary neg pulmonary ROS, former smoker,    breath sounds clear to auscultation       Cardiovascular Exercise Tolerance: Good  Rhythm:Regular     Neuro/Psych negative psych ROS   GI/Hepatic Neg liver ROS, GERD  Medicated,  Endo/Other  negative endocrine ROS  Renal/GU   negative genitourinary   Musculoskeletal negative musculoskeletal ROS (+)   Abdominal Normal abdominal exam  (+)   Peds negative pediatric ROS (+)  Hematology negative hematology ROS (+)   Anesthesia Other Findings   Reproductive/Obstetrics                             Anesthesia Physical Anesthesia Plan  ASA: I  Anesthesia Plan: General   Post-op Pain Management:    Induction: Intravenous  PONV Risk Score and Plan:   Airway Management Planned: Natural Airway and Nasal Cannula  Additional Equipment:   Intra-op Plan:   Post-operative Plan:   Informed Consent: I have reviewed the patients History and Physical, chart, labs and discussed the procedure including the risks, benefits and alternatives for the proposed anesthesia with the patient or authorized representative who has indicated his/her understanding and acceptance.     Plan Discussed with: CRNA  Anesthesia Plan Comments:         Anesthesia Quick Evaluation

## 2017-05-29 NOTE — Transfer of Care (Signed)
Immediate Anesthesia Transfer of Care Note  Patient: Margaret Hayden  Procedure(s) Performed: Procedure(s): FULL UPPER ENDOSCOPIC ULTRASOUND (EUS) RADIAL (N/A)  Patient Location: PACU  Anesthesia Type:General  Level of Consciousness: sedated  Airway & Oxygen Therapy: Patient Spontanous Breathing and Patient connected to face mask oxygen  Post-op Assessment: Report given to RN and Post -op Vital signs reviewed and stable  Post vital signs: Reviewed and stable  Last Vitals:  Vitals:   05/29/17 1342 05/29/17 1434  BP: 114/67 108/70  Pulse: 85 96  Resp: 20 19  Temp: 37.3 C 36.4 C  SpO2: 99% 100%    Last Pain:  Vitals:   05/29/17 1434  TempSrc: Tympanic      Patients Stated Pain Goal: 0 (05/29/17 1342)  Complications: No apparent anesthesia complications

## 2017-05-29 NOTE — Anesthesia Post-op Follow-up Note (Signed)
Anesthesia QCDR form completed.        

## 2017-05-29 NOTE — H&P (Signed)
PRE-PROCEDURE HISTORY AND PHYSICAL   Margaret Hayden presents for her scheduled Procedure(s): FULL UPPER ENDOSCOPIC ULTRASOUND (EUS) RADIAL.  The indication for the procedure(s) is EUS to evaluate gall bladder to evaluate for sludge or gall stones.  There have been no significant recent changes in the patient's medical status.  Past Medical History:  Diagnosis Date  . GERD (gastroesophageal reflux disease)   . Kidney stone    cyst on kidney right on scan about 1 month ago    Past Surgical History:  Procedure Laterality Date  . COLONOSCOPY WITH PROPOFOL N/A 01/02/2017   Procedure: COLONOSCOPY WITH PROPOFOL;  Surgeon: Wyline MoodKiran Anna, MD;  [facility] END[facility]COPY;  Service: Endoscopy;  Laterality: N/A;  . NO PAST SURGERIES      Allergies No Known Allergies  Medications Norgestimate-Ethinyl Estradiol Triphasic, amitriptyline, hydrOXYzine, omeprazole, and promethazine  Physical Examination  Body mass index is 24.03 kg/m. BP 114/67   Pulse 85   Temp 99.2 F (37.3 C) (Tympanic)   Resp 20   Ht 5\' 4"  (1.626 m)   Wt 63.5 kg (140 lb)   LMP 05/29/2017   SpO2 99%   BMI 24.03 kg/m  General:   Alert,  pleasant and cooperative in NAD Head:  Normocephalic and atraumatic. Neck:  Supple; no masses or thyromegaly. Lungs:  Clear throughout to auscultation.    Heart:  Regular rate and rhythm. Abdomen:  Soft, nontender and nondistended. Normal bowel sounds, without guarding, and without rebound.   Neurologic:  Alert and  oriented x4;  grossly normal neurologically.  ASSESSMENT AND PLAN  Margaret Hayden has been evaluated and deemed appropriate to undergo the planned Procedure(s): FULL UPPER ENDOSCOPIC ULTRASOUND (EUS) RADIAL.

## 2017-05-30 ENCOUNTER — Telehealth: Payer: Self-pay

## 2017-05-30 ENCOUNTER — Other Ambulatory Visit: Payer: Self-pay

## 2017-05-30 ENCOUNTER — Encounter: Payer: Self-pay | Admitting: Gastroenterology

## 2017-05-30 DIAGNOSIS — K824 Cholesterolosis of gallbladder: Secondary | ICD-10-CM

## 2017-05-30 NOTE — Telephone Encounter (Signed)
Advised patient of appointment with Dr. Tonita CongWoodham @ [facility] on 06/04/17 @ 215pm[facility]

## 2017-05-30 NOTE — Progress Notes (Signed)
  Oncology Nurse Navigator Documentation EUS report routed to Dr. Cleta AlbertsAnna Navigator Location: [facility] (05/30/17 1200)   )Navigator Encounter Type: Letter/Fax/Email (05/30/17 1200)                         Barriers/Navigation Needs: Coordination of Care (05/30/17 1200)   Interventions: Coordination of Care (05/30/17 1200)   Coordination of Care: EUS (05/30/17 1200)                  Time Spent with Patient: 15 (05/30/17 1200)

## 2017-05-30 NOTE — Telephone Encounter (Signed)
Advised patient of EUS results per Dr. Tobi BastosAnna.   Sent referral to Dr. Everlene FarrierPabon for possibly cholecystectomy.   2 polyps in GB per results.  Called [facility] for appointment.

## 2017-06-04 ENCOUNTER — Ambulatory Visit (INDEPENDENT_AMBULATORY_CARE_PROVIDER_SITE_OTHER): Payer: BLUE CROSS/BLUE SHIELD | Admitting: General Surgery

## 2017-06-04 ENCOUNTER — Other Ambulatory Visit: Payer: Self-pay | Admitting: Gastroenterology

## 2017-06-04 ENCOUNTER — Encounter: Payer: Self-pay | Admitting: General Surgery

## 2017-06-04 VITALS — BP 128/90 | HR 84 | Temp 98.3°F | Wt 142.0 lb

## 2017-06-04 DIAGNOSIS — K8050 Calculus of bile duct without cholangitis or cholecystitis without obstruction: Secondary | ICD-10-CM | POA: Insufficient documentation

## 2017-06-04 NOTE — Progress Notes (Signed)
Patient ID: Margaret Hayden Adriance, female   DOB: 05/16/1981, 36 y.o.   MRN: [id number]  CC: Right upper quadrant pain  HPI Margaret Hayden Jeane is a 36 y.o. female who presents to clinic today for evaluation of right upper quadrant pain, nausea. Patient reports for the last year she's had an extensive GI workup for her nausea and right upper quadrant pain. She reports that the nausea sometimes his all the time but is worsened when she has the right upper quadrant abdominal pain. The pain is usually food related, especially when she eats fatty or fried foods. She has had an extensive workup to include endoscopies, ultrasound, HIDA scan, endoscopic ultrasound. The most recent of which showed the possibility of polyps within the gallbladder. The HIDA scan had a normal ejection fraction but caused her symptoms as well. Denies any current fevers, chills, chest pain, shortness of breath, diarrhea, constipation.  HPI  Past Medical History:  Diagnosis Date  . Anxiety   . Gallbladder polyp   . GERD (gastroesophageal reflux disease)   . Kidney stone    cyst on kidney right on scan about 1 month ago  . Unintentional weight loss     Past Surgical History:  Procedure Laterality Date  . COLONOSCOPY WITH PROPOFOL N/A 01/02/2017   Procedure: COLONOSCOPY WITH PROPOFOL;  Surgeon: Wyline MoodKiran Anna, MD;  [facility] ENDOSCOPY;  Service: Endoscopy;  Laterality: N/A;  . EUS N/A 05/29/2017   Procedure: FULL UPPER ENDOSCOPIC ULTRASOUND (EUS) RADIAL;  Surgeon: Doren CustardSpaete, Joshua P, MD;  Location: [facility] ENDOSCOPY;  Service: Gastroenterology;  Laterality: N/A;  . NO PAST SURGERIES      Family History  Problem Relation Age of Onset  . Gallbladder disease Mother   . Diabetes Mother   . Gallbladder disease Father   . Appendicitis Father   . Breast cancer Maternal Grandmother   . Colon cancer Paternal Grandfather   . Breast cancer Maternal Aunt     Social History Social History  Substance Use Topics  . Smoking status:  Former Smoker    Types: Cigarettes  . Smokeless tobacco: Never Used  . Alcohol use No    No Known Allergies  Current Outpatient Prescriptions  Medication Sig Dispense Refill  . amitriptyline (ELAVIL) 10 MG tablet TAKE 1 TABLET (10 MG TOTAL) BY MOUTH AT BEDTIME.  0  . hydrOXYzine (ATARAX/VISTARIL) 25 MG tablet Take 1 tablet (25 mg total) by mouth 2 (two) times daily as needed for anxiety. 60 tablet 0  . Norgestimate-Ethinyl Estradiol Triphasic (TRI-LO-MARZIA) 0.18/0.215/0.25 MG-25 MCG tab Take by mouth.    Marland Kitchen. omeprazole (PRILOSEC) 20 MG capsule Take by mouth.    . promethazine (PHENERGAN) 25 MG tablet TAKE 1 TABLET (25 MG TOTAL) BY MOUTH EVERY 6 (SIX) HOURS AS NEEDED FOR NAUSEA OR VOMITING. 30 tablet 0   No current facility-administered medications for this visit.      Review of Systems A Multi-point review of systems was asked and was negative except for the findings documented in the history of present illness  Physical Exam Blood pressure 128/90, pulse 84, temperature 98.3 F (36.8 C), temperature source Oral, weight 64.4 kg (142 lb), last menstrual period 05/29/2017, unknown if currently breastfeeding. CONSTITUTIONAL: No acute distress. EYES: Pupils are equal, round, and reactive to light, Sclera are non-icteric. EARS, NOSE, MOUTH AND THROAT: The oropharynx is clear. The oral mucosa is pink and moist. Hearing is intact to voice. LYMPH NODES:  Lymph nodes in the neck are normal. RESPIRATORY:  Lungs are clear. There  is normal respiratory effort, with equal breath sounds bilaterally, and without pathologic use of accessory muscles. CARDIOVASCULAR: Heart is regular without murmurs, gallops, or rubs. GI: The abdomen is soft, nontender, and nondistended. There are no palpable masses. There is no hepatosplenomegaly. There are normal bowel sounds in all quadrants. GU: Rectal deferred.   MUSCULOSKELETAL: Normal muscle strength and tone. No cyanosis or edema.   SKIN: Turgor is good and  there are no pathologic skin lesions or ulcers. NEUROLOGIC: Motor and sensation is grossly normal. Cranial nerves are grossly intact. PSYCH:  Oriented to person, place and time. Affect is normal.  Data Reviewed Images and labs reviewed. Labs are from 6 months ago but are all within normal limits. Gastric emptying study 6 months ago was normal. Ultrasound of the abdomen from 3 months ago failed to show any gallstones or signs of cholecystitis. HIDA scan from 2 months ago shows normal ejection fraction but caused her pain. Endoscopic ultrasound from earlier this month shows likely gallbladder polyps I have personally reviewed the patient's imaging, laboratory findings and medical records.    Assessment    Biliary colic    Plan    36 year old female with a history consistent with biliary colic. We discussed the risks and benefits of a laparoscopic cholecystectomy and possible cholangiogram including, but not limited to bleeding, infection, injury to surrounding structures such as the intestine or liver, bile leak, retained gallstones, need to convert to an open procedure, prolonged diarrhea, blood clots such as  DVT, common bile duct injury, anesthesia risks, and possible need for additional procedures.  The likelihood of improvement in symptoms and return to the patient's normal status is good. We discussed the typical post-operative recovery course. Patient voiced understanding and desires to proceed. Discussed the possibility of using the [employer] robotic system as well but the patient stated she wanted to the Price difference. We will tentatively book her for a robotic laparoscopic cholecystectomy for October 4 with the understanding that it may change if there is a huge Price difference.      Time spent with the patient was 45 minutes, with more than 50% of the time spent in face-to-face education, counseling and care coordination.     Ricarda Frame, MD FACS General Surgeon 06/04/2017,  3:34 PM

## 2017-06-04 NOTE — Patient Instructions (Addendum)
Please look at your blue sheet in case you have any questions about your surgery.  Your surgery will be done on Thursday June 26, 2017.   Please call your insurance company and ask for the cost of a Robotic Cholecystectomy instead of a Laparoscopic Cholecystectomy.

## 2017-06-09 ENCOUNTER — Telehealth: Payer: Self-pay

## 2017-06-09 NOTE — Telephone Encounter (Signed)
The patient has been advised of the Pre Op date and time and the Surgery date.  Surgery: 06/26/17- Dr Tonita Cong  - Robotic Assisted Lap Chole  Pre Op: 06/19/17 between 1p-5p, Phone call.  Patient made aware to call [phone], between 1-3 PM the day before surgery to find out what time to arrive.

## 2017-06-09 NOTE — Anesthesia Postprocedure Evaluation (Signed)
Anesthesia Post Note  Patient: Margaret Hayden  Procedure(s) Performed: Procedure(s) (LRB): FULL UPPER ENDOSCOPIC ULTRASOUND (EUS) RADIAL (N/A)  Patient location during evaluation: PACU Anesthesia Type: General Level of consciousness: awake Pain management: pain level controlled Vital Signs Assessment: post-procedure vital signs reviewed and stable Respiratory status: spontaneous breathing Cardiovascular status: stable Anesthetic complications: no     Last Vitals:  Vitals:   05/29/17 1454 05/29/17 1504  BP: 110/64 118/66  Pulse: 70 63  Resp: 19 19  Temp:    SpO2: 100% 100%    Last Pain:  Vitals:   05/29/17 1434  TempSrc: Tympanic                 VAN STAVEREN,Sabre Romberger

## 2017-06-11 ENCOUNTER — Ambulatory Visit: Payer: BLUE CROSS/BLUE SHIELD | Admitting: Gastroenterology

## 2017-06-19 ENCOUNTER — Inpatient Hospital Stay: Admission: RE | Admit: 2017-06-19 | Payer: BLUE CROSS/BLUE SHIELD | Source: Ambulatory Visit

## 2017-06-19 ENCOUNTER — Encounter
Admission: RE | Admit: 2017-06-19 | Discharge: 2017-06-19 | Disposition: A | Discharge status: Home / Self Care | Payer: BLUE CROSS/BLUE SHIELD | Source: Ambulatory Visit | Attending: General Surgery | Admitting: General Surgery

## 2017-06-19 HISTORY — DX: Personal history of urinary calculi: Z87.442

## 2017-06-19 NOTE — Patient Instructions (Signed)
Your procedure is scheduled on: 06-26-17 Report to Same Day Surgery 2nd floor medical mall [facility] Entrance-take elevator on left to 2nd floor.  Check in with surgery information desk.) To find out your arrival time please call [phone] between 1PM - 3PM on 06-25-17  Remember: Instructions that are not followed completely may result in serious medical risk, up to and including death, or upon the discretion of your surgeon and anesthesiologist your surgery may need to be rescheduled.    _x___ 1. Do not eat food after midnight the night before your procedure. You may drink clear liquids up to 2 hours before you are scheduled to arrive at the hospital for your procedure.  Do not drink clear liquids within 2 hours of your scheduled arrival to the hospital.  Clear liquids include  --Water or Apple juice without pulp  --Clear carbohydrate beverage such as ClearFast or Gatorade  --Black Coffee or Clear Tea (No milk, no creamers, do not add anything to  the coffee or Tea)  Type 1 and type 2 diabetics should only drink water.  No gum chewing or hard candies.     __x__ 2. No Alcohol for 24 hours before or after surgery.   __x__3. No Smoking for 24 prior to surgery.   ____  4. Bring all medications with you on the day of surgery if instructed.    __x__ 5. Notify your doctor if there is any change in your medical condition     (cold, fever, infections).     Do not wear jewelry, make-up, hairpins, clips or nail polish.  Do not wear lotions, powders, or perfumes. You may wear deodorant.  Do not shave 48 hours prior to surgery. Men may shave face and neck.  Do not bring valuables to the hospital.    [facility] is not responsible for any belongings or valuables.               Contacts, dentures or bridgework may not be worn into surgery.  Leave your suitcase in the car. After surgery it may be brought to your room.  For patients admitted to the hospital, discharge time is determined by  your treatment team.   Patients discharged the day of surgery will not be allowed to drive home.  You will need someone to drive you home and stay with you the night of your procedure.    Please read over the following fact sheets that you were given:   [facility] Preparing for Surgery and or MRSA Information   _x___ TAKE THE FOLLOWING MEDICATIONS THE MORNING OF SURGERY WITH A SMALL SIP OF WATER. These include:  1. PRILOSEC  2. TAKE A PRILOSEC Wednesday NIGHT BEFORE BED (06-25-17)  3. YOU MAY TAKE A HYDROXYZINE AM OF SURGERY IF NEEDED WITH SIP OF WATER  4.  5.  6.  ____Fleets enema or Magnesium Citrate as directed.   ____ Use CHG Soap or sage wipes as directed on instruction sheet   ____ Use inhalers on the day of surgery and bring to hospital day of surgery  ____ Stop Metformin and Janumet 2 days prior to surgery.    ____ Take 1/2 of usual insulin dose the night before surgery and none on the morning surgery.   ____ Follow recommendations from Cardiologist, Pulmonologist or PCP regarding stopping Aspirin, Coumadin, Plavix ,Eliquis, Effient, or Pradaxa, and Pletal.  X____Stop Anti-inflammatories such as Advil, Aleve, Ibuprofen, Motrin, Naproxen, Naprosyn, Goodies powders or aspirin products NOW-OK to take Tylenol  ____ Stop supplements until after surgery.     ____ Bring C-Pap to the hospital.

## 2017-06-25 MED ORDER — CEFAZOLIN SODIUM-DEXTROSE 2-4 GM/100ML-% IV SOLN
2.0000 g | INTRAVENOUS | Status: AC
  Administered 2017-06-26: 2 g via INTRAVENOUS

## 2017-06-26 ENCOUNTER — Ambulatory Visit
Admission: RE | Admit: 2017-06-26 | Discharge: 2017-06-26 | Disposition: A | Discharge status: Home / Self Care | Payer: BLUE CROSS/BLUE SHIELD | Source: Ambulatory Visit | Attending: General Surgery | Admitting: General Surgery

## 2017-06-26 ENCOUNTER — Encounter
Admission: RE | Disposition: A | Discharge status: Home / Self Care | Payer: Self-pay | Source: Ambulatory Visit | Attending: General Surgery

## 2017-06-26 ENCOUNTER — Encounter: Payer: Self-pay | Admitting: *Deleted

## 2017-06-26 ENCOUNTER — Ambulatory Visit: Payer: BLUE CROSS/BLUE SHIELD | Admitting: Anesthesiology

## 2017-06-26 DIAGNOSIS — R1011 Right upper quadrant pain: Secondary | ICD-10-CM | POA: Diagnosis present

## 2017-06-26 DIAGNOSIS — Z79899 Other long term (current) drug therapy: Secondary | ICD-10-CM | POA: Diagnosis not present

## 2017-06-26 DIAGNOSIS — K8050 Calculus of bile duct without cholangitis or cholecystitis without obstruction: Secondary | ICD-10-CM

## 2017-06-26 DIAGNOSIS — K219 Gastro-esophageal reflux disease without esophagitis: Secondary | ICD-10-CM | POA: Insufficient documentation

## 2017-06-26 DIAGNOSIS — Z87891 Personal history of nicotine dependence: Secondary | ICD-10-CM | POA: Diagnosis not present

## 2017-06-26 DIAGNOSIS — K811 Chronic cholecystitis: Secondary | ICD-10-CM | POA: Insufficient documentation

## 2017-06-26 DIAGNOSIS — Z8379 Family history of other diseases of the digestive system: Secondary | ICD-10-CM | POA: Insufficient documentation

## 2017-06-26 DIAGNOSIS — F419 Anxiety disorder, unspecified: Secondary | ICD-10-CM | POA: Insufficient documentation

## 2017-06-26 HISTORY — PX: CHOLECYSTECTOMY: SHX55

## 2017-06-26 LAB — POCT PREGNANCY, URINE: Preg Test, Ur: NEGATIVE

## 2017-06-26 SURGERY — LAPAROSCOPIC CHOLECYSTECTOMY
Anesthesia: General | Wound class: Clean Contaminated

## 2017-06-26 MED ORDER — DEXAMETHASONE SODIUM PHOSPHATE 10 MG/ML IJ SOLN
INTRAMUSCULAR | Status: AC
  Filled 2017-06-26: qty 1

## 2017-06-26 MED ORDER — FENTANYL CITRATE (PF) 100 MCG/2ML IJ SOLN
25.0000 ug | INTRAMUSCULAR | Status: DC | PRN
  Administered 2017-06-26 (×4): 25 ug via INTRAVENOUS

## 2017-06-26 MED ORDER — LIDOCAINE HCL 1 % IJ SOLN
INTRAMUSCULAR | Status: DC | PRN
  Administered 2017-06-26: 19 mL via SUBCUTANEOUS

## 2017-06-26 MED ORDER — BUPIVACAINE HCL (PF) 0.5 % IJ SOLN
INTRAMUSCULAR | Status: AC
  Filled 2017-06-26: qty 30

## 2017-06-26 MED ORDER — LIDOCAINE HCL (PF) 2 % IJ SOLN
INTRAMUSCULAR | Status: AC
  Filled 2017-06-26: qty 2

## 2017-06-26 MED ORDER — KETOROLAC TROMETHAMINE 30 MG/ML IJ SOLN
INTRAMUSCULAR | Status: DC | PRN
  Administered 2017-06-26: 30 mg via INTRAVENOUS

## 2017-06-26 MED ORDER — SUGAMMADEX SODIUM 200 MG/2ML IV SOLN
INTRAVENOUS | Status: DC | PRN
Start: 2017-06-26 — End: 2017-06-26
  Administered 2017-06-26: 128.8 mg via INTRAVENOUS

## 2017-06-26 MED ORDER — CHLORHEXIDINE GLUCONATE CLOTH 2 % EX PADS: 6.0000 | MEDICATED_PAD | Freq: Once | CUTANEOUS | Status: DC

## 2017-06-26 MED ORDER — FENTANYL CITRATE (PF) 100 MCG/2ML IJ SOLN
INTRAMUSCULAR | Status: AC
  Administered 2017-06-26: 25 ug via INTRAVENOUS
  Filled 2017-06-26: qty 2

## 2017-06-26 MED ORDER — PROPOFOL 10 MG/ML IV BOLUS
INTRAVENOUS | Status: AC
  Filled 2017-06-26: qty 20

## 2017-06-26 MED ORDER — ROCURONIUM BROMIDE 100 MG/10ML IV SOLN
INTRAVENOUS | Status: DC | PRN
  Administered 2017-06-26: 20 mg via INTRAVENOUS
  Administered 2017-06-26: 30 mg via INTRAVENOUS

## 2017-06-26 MED ORDER — SODIUM CHLORIDE 0.9 % IJ SOLN
INTRAMUSCULAR | Status: AC
  Filled 2017-06-26: qty 50

## 2017-06-26 MED ORDER — LACTATED RINGERS IV SOLN
INTRAVENOUS | Status: DC | PRN
  Administered 2017-06-26: 07:00:00 via INTRAVENOUS

## 2017-06-26 MED ORDER — HYDROCODONE-ACETAMINOPHEN 5-325 MG PO TABS: 1.0000 | ORAL_TABLET | Freq: Four times a day (QID) | ORAL | 0 refills | Status: DC | PRN

## 2017-06-26 MED ORDER — CEFAZOLIN SODIUM-DEXTROSE 2-4 GM/100ML-% IV SOLN
INTRAVENOUS | Status: AC
  Filled 2017-06-26: qty 100

## 2017-06-26 MED ORDER — PROPOFOL 10 MG/ML IV BOLUS
INTRAVENOUS | Status: DC | PRN
  Administered 2017-06-26: 200 mg via INTRAVENOUS

## 2017-06-26 MED ORDER — DIPHENHYDRAMINE HCL 50 MG/ML IJ SOLN
INTRAMUSCULAR | Status: AC
  Filled 2017-06-26: qty 1

## 2017-06-26 MED ORDER — ONDANSETRON HCL 4 MG/2ML IJ SOLN
INTRAMUSCULAR | Status: AC
  Filled 2017-06-26: qty 2

## 2017-06-26 MED ORDER — FENTANYL CITRATE (PF) 100 MCG/2ML IJ SOLN
INTRAMUSCULAR | Status: DC | PRN
  Administered 2017-06-26 (×2): 50 ug via INTRAVENOUS
  Administered 2017-06-26: 100 ug via INTRAVENOUS
  Administered 2017-06-26: 50 ug via INTRAVENOUS

## 2017-06-26 MED ORDER — DEXAMETHASONE SODIUM PHOSPHATE 10 MG/ML IJ SOLN
INTRAMUSCULAR | Status: DC | PRN
  Administered 2017-06-26: 10 mg via INTRAVENOUS

## 2017-06-26 MED ORDER — CHLORHEXIDINE GLUCONATE CLOTH 2 % EX PADS
6.0000 | MEDICATED_PAD | Freq: Once | CUTANEOUS | Status: AC
  Administered 2017-06-26: 6 via TOPICAL

## 2017-06-26 MED ORDER — LIDOCAINE HCL 2 % EX GEL
CUTANEOUS | Status: AC
  Filled 2017-06-26: qty 5

## 2017-06-26 MED ORDER — LIDOCAINE HCL (CARDIAC) 20 MG/ML IV SOLN
INTRAVENOUS | Status: DC | PRN
  Administered 2017-06-26: 100 mg via INTRAVENOUS

## 2017-06-26 MED ORDER — LACTATED RINGERS IV SOLN
INTRAVENOUS | Status: DC
  Administered 2017-06-26: 07:00:00 via INTRAVENOUS

## 2017-06-26 MED ORDER — ONDANSETRON HCL 4 MG/2ML IJ SOLN
INTRAMUSCULAR | Status: DC | PRN
  Administered 2017-06-26: 4 mg via INTRAVENOUS

## 2017-06-26 MED ORDER — DIPHENHYDRAMINE HCL 50 MG/ML IJ SOLN
INTRAMUSCULAR | Status: DC | PRN
  Administered 2017-06-26: 12.5 mg via INTRAVENOUS

## 2017-06-26 MED ORDER — ONDANSETRON HCL 4 MG/2ML IJ SOLN: 4.0000 mg | Freq: Once | INTRAMUSCULAR | Status: DC | PRN

## 2017-06-26 MED ORDER — FENTANYL CITRATE (PF) 250 MCG/5ML IJ SOLN
INTRAMUSCULAR | Status: AC
  Filled 2017-06-26: qty 5

## 2017-06-26 MED ORDER — MIDAZOLAM HCL 2 MG/2ML IJ SOLN
INTRAMUSCULAR | Status: DC | PRN
  Administered 2017-06-26: 2 mg via INTRAVENOUS

## 2017-06-26 MED ORDER — LIDOCAINE HCL (PF) 1 % IJ SOLN
INTRAMUSCULAR | Status: AC
  Filled 2017-06-26: qty 30

## 2017-06-26 MED ORDER — MIDAZOLAM HCL 2 MG/2ML IJ SOLN
INTRAMUSCULAR | Status: AC
  Filled 2017-06-26: qty 2

## 2017-06-26 SURGICAL SUPPLY — 45 items
ADH LQ OCL WTPRF AMP STRL LF (MISCELLANEOUS) ×1
ADHESIVE MASTISOL STRL (MISCELLANEOUS) ×2 IMPLANT
APPLIER CLIP ROT 10 11.4 M/L (STAPLE) ×2
APR CLP MED LRG 11.4X10 (STAPLE) ×1
BLADE SURG SZ11 CARB STEEL (BLADE) ×2 IMPLANT
CANISTER SUCT 1200ML W/VALVE (MISCELLANEOUS) ×2 IMPLANT
CATH CHOLANG 76X19 KUMAR (CATHETERS) ×2 IMPLANT
CHLORAPREP W/TINT 26ML (MISCELLANEOUS) ×2 IMPLANT
CLIP APPLIE ROT 10 11.4 M/L (STAPLE) ×1 IMPLANT
CONRAY 60ML FOR OR (MISCELLANEOUS) IMPLANT
DECANTER SPIKE VIAL GLASS SM (MISCELLANEOUS) ×4 IMPLANT
DRAPE SHEET LG 3/4 BI-LAMINATE (DRAPES) ×2 IMPLANT
DRSG TEGADERM 2-3/8X2-3/4 SM (GAUZE/BANDAGES/DRESSINGS) ×8 IMPLANT
DRSG TELFA 4X3 1S NADH ST (GAUZE/BANDAGES/DRESSINGS) ×2 IMPLANT
ELECT REM PT RETURN 9FT ADLT (ELECTROSURGICAL) ×2
ELECTRODE REM PT RTRN 9FT ADLT (ELECTROSURGICAL) ×1 IMPLANT
GLOVE BIO SURGEON STRL SZ7.5 (GLOVE) ×4 IMPLANT
GLOVE INDICATOR 8.0 STRL GRN (GLOVE) ×2 IMPLANT
GOWN STRL REUS W/ TWL LRG LVL3 (GOWN DISPOSABLE) ×3 IMPLANT
GOWN STRL REUS W/TWL LRG LVL3 (GOWN DISPOSABLE) ×6
GRASPER SUT TROCAR 14GX15 (MISCELLANEOUS) IMPLANT
IRRIGATION STRYKERFLOW (MISCELLANEOUS) IMPLANT
IRRIGATOR STRYKERFLOW (MISCELLANEOUS) ×2
IV NS 1000ML (IV SOLUTION) ×2
IV NS 1000ML BAXH (IV SOLUTION) IMPLANT
L-HOOK LAP DISP 36CM (ELECTROSURGICAL) ×2
LABEL OR SOLS (LABEL) ×2 IMPLANT
LHOOK LAP DISP 36CM (ELECTROSURGICAL) ×1 IMPLANT
NDL HYPO 25X1 1.5 SAFETY (NEEDLE) ×1 IMPLANT
NEEDLE HYPO 25X1 1.5 SAFETY (NEEDLE) ×2 IMPLANT
NEEDLE VERESS 14GA 120MM (NEEDLE) ×2 IMPLANT
NS IRRIG 500ML POUR BTL (IV SOLUTION) ×2 IMPLANT
PACK LAP CHOLECYSTECTOMY (MISCELLANEOUS) ×2 IMPLANT
PENCIL ELECTRO HAND CTR (MISCELLANEOUS) ×2 IMPLANT
POUCH ENDO CATCH 10MM SPEC (MISCELLANEOUS) ×2 IMPLANT
SCISSORS METZENBAUM CVD 33 (INSTRUMENTS) ×2 IMPLANT
SLEEVE ENDOPATH XCEL 5M (ENDOMECHANICALS) ×4 IMPLANT
STRIP CLOSURE SKIN 1/2X4 (GAUZE/BANDAGES/DRESSINGS) ×1 IMPLANT
SUT MNCRL 4-0 (SUTURE) ×2
SUT MNCRL 4-0 27XMFL (SUTURE) ×1
SUT VICRYL 0 AB UR-6 (SUTURE) IMPLANT
SUTURE MNCRL 4-0 27XMF (SUTURE) ×1 IMPLANT
TROCAR XCEL 12X100 BLDLESS (ENDOMECHANICALS) ×2 IMPLANT
TROCAR XCEL NON-BLD 5MMX100MML (ENDOMECHANICALS) ×2 IMPLANT
TUBING INSUFFLATOR HI FLOW (MISCELLANEOUS) ×2 IMPLANT

## 2017-06-26 NOTE — Op Note (Signed)
Laparoscopic Cholecystectomy  Pre-operative Diagnosis: biliary colic  Post-operative Diagnosis: same  Procedure: Laparoscopic Cholecystectomy  Surgeon: Leonette Most T. Tonita Cong, MD FACS  Anesthesia: Gen. with endotracheal tube  Assistant:None  Procedure Details  The patient was seen again in the Holding Room. The benefits, complications, treatment options, and expected outcomes were discussed with the patient. The risks of bleeding, infection, recurrence of symptoms, failure to resolve symptoms, bile duct damage, bile duct leak, retained common bile duct stone, bowel injury, any of which could require further surgery and/or ERCP, stent, or papillotomy were reviewed with the patient. The likelihood of improving the patient's symptoms with return to their baseline status is good.  The patient and/or family concurred with the proposed plan, giving informed consent.  The patient was taken to Operating Room, identified as Margaret Hayden and the procedure verified as Laparoscopic Cholecystectomy.  A Time Out was held and the above information confirmed.  Prior to the induction of general anesthesia, antibiotic prophylaxis was administered. VTE prophylaxis was in place. General endotracheal anesthesia was then administered and tolerated well. After the induction, the abdomen was prepped with Chloraprep and draped in the sterile fashion. The patient was positioned in the supine position.  Local anesthetic  was injected into the skin near the umbilicus and an incision made. The Veress needle was placed. Pneumoperitoneum was then created with CO2 and tolerated well without any adverse changes in the patient's vital signs. A 5mm opti-view port was placed in the periumbilical position and the abdominal cavity was explored.  Two 5-mm ports were placed in the right upper quadrant and a 12 mm epigastric port was placed all under direct vision. All skin incisions  were infiltrated with a local anesthetic agent  before making the incision and placing the trocars.   The patient was positioned  in reverse Trendelenburg, tilted slightly to the patient's left.  The gallbladder was identified, the fundus grasped and retracted cephalad. Adhesions were lysed bluntly. The infundibulum was grasped and retracted laterally, exposing the peritoneum overlying the triangle of Calot. This was then divided and exposed in a blunt fashion. A critical view of the cystic duct and cystic artery was obtained.  The cystic duct was clearly identified and bluntly dissected. There were chronic changes of inflammation around the gallbladder.  The initial clip applier misfired, it was replaced and then the duct and artery that were both easily visible were serially clipped and cut inbetween the clips.  The gallbladder was taken from the gallbladder fossa in a retrograde fashion with the electrocautery. The gallbladder was removed and placed in an Endocatch bag. The liver bed was irrigated and inspected. Hemostasis was achieved with the electrocautery. Copious irrigation was utilized and was repeatedly aspirated until clear.  The gallbladder and Endocatch sac were then removed through the epigastric port site.   Inspection of the right upper quadrant was performed. No bleeding, bile duct injury or leak, or bowel injury was noted. Pneumoperitoneum was released.  The epigastric port site was closed with figure-of-eight 0 Vicryl sutures. 4-0 subcuticular Monocryl was used to close the skin. Steristrips and Mastisol and sterile dressings were  applied.  The patient was then extubated and brought to the recovery room in stable condition. Sponge, lap, and needle counts were correct at closure and at the conclusion of the case.   Findings: Previous Cholecystitis   Estimated Blood Loss: 10 mL         Drains: None         Specimens: Gallbladder  Complications: none               Wanza Szumski T. Adonis Huguenin, MD, FACS

## 2017-06-26 NOTE — Interval H&P Note (Signed)
History and Physical Interval Note:  06/26/2017 7:11 AM  Beverly Sessions  has presented today for surgery, with the diagnosis of BILIARY COLIC  The various methods of treatment have been discussed with the patient and family. After consideration of risks, benefits and other options for treatment, the patient has consented to  Procedure(s): LAPAROSCOPIC CHOLECYSTECTOMY (N/A) as a surgical intervention .  The patient's history has been reviewed, patient examined, no change in status, stable for surgery.  I have reviewed the patient's chart and labs.  Questions were answered to the patient's satisfaction.     Ricarda Frame

## 2017-06-26 NOTE — Anesthesia Postprocedure Evaluation (Signed)
Anesthesia Post Note  Patient: Margaret Hayden  Procedure(s) Performed: LAPAROSCOPIC CHOLECYSTECTOMY (N/A )  Patient location during evaluation: PACU Anesthesia Type: General Level of consciousness: awake and alert Pain management: pain level controlled Vital Signs Assessment: post-procedure vital signs reviewed and stable Respiratory status: spontaneous breathing and respiratory function stable Cardiovascular status: stable Anesthetic complications: no     Last Vitals:  Vitals:   06/26/17 0836 06/26/17 0846  BP: (!) 112/57   Pulse: 72 86  Resp: 18 (!) 22  Temp: 36.6 C   SpO2: 100% 100%    Last Pain:  Vitals:   06/26/17 0846  TempSrc:   PainSc: 2                  Achilles Neville K

## 2017-06-26 NOTE — Anesthesia Post-op Follow-up Note (Signed)
Anesthesia QCDR form completed.        

## 2017-06-26 NOTE — Anesthesia Preprocedure Evaluation (Signed)
Anesthesia Evaluation  Patient identified by MRN, date of birth, ID band Patient awake    Reviewed: Allergy & Precautions, NPO status , Patient's Chart, lab work & pertinent test results  History of Anesthesia Complications Negative for: history of anesthetic complications  Airway Mallampati: III       Dental   Pulmonary neg sleep apnea, neg COPD, former smoker,           Cardiovascular (-) hypertension(-) Past MI and (-) CHF (-) dysrhythmias (-) Valvular Problems/Murmurs     Neuro/Psych neg Seizures Anxiety    GI/Hepatic Neg liver ROS, GERD  Medicated and Controlled,  Endo/Other  neg diabetes  Renal/GU Renal disease (stones)     Musculoskeletal   Abdominal   Peds  Hematology   Anesthesia Other Findings   Reproductive/Obstetrics                             Anesthesia Physical Anesthesia Plan  ASA: II  Anesthesia Plan: General   Post-op Pain Management:    Induction: Intravenous  PONV Risk Score and Plan:   Airway Management Planned: Oral ETT  Additional Equipment:   Intra-op Plan:   Post-operative Plan:   Informed Consent: I have reviewed the patients History and Physical, chart, labs and discussed the procedure including the risks, benefits and alternatives for the proposed anesthesia with the patient or authorized representative who has indicated his/her understanding and acceptance.     Plan Discussed with:   Anesthesia Plan Comments:         Anesthesia Quick Evaluation

## 2017-06-26 NOTE — Anesthesia Procedure Notes (Signed)
Procedure Name: Intubation Date/Time: 06/26/2017 7:52 AM Performed by: Timoteo Expose Pre-anesthesia Checklist: Patient identified, Emergency Drugs available, Suction available, Patient being monitored and Timeout performed Patient Re-evaluated:Patient Re-evaluated prior to induction Oxygen Delivery Method: Circle system utilized Preoxygenation: Pre-oxygenation with 100% oxygen Induction Type: IV induction Ventilation: Mask ventilation without difficulty Laryngoscope Size: Mac and 3 Grade View: Grade I Tube type: Oral Tube size: 6.5 mm Number of attempts: 1 Placement Confirmation: ETT inserted through vocal cords under direct vision,  positive ETCO2,  CO2 detector and breath sounds checked- equal and bilateral

## 2017-06-26 NOTE — Brief Op Note (Signed)
06/26/2017  8:25 AM  PATIENT:  Margaret Hayden  36 y.o. female  PRE-OPERATIVE DIAGNOSIS:  BILIARY COLIC  POST-OPERATIVE DIAGNOSIS:  BILIARY COLIC  PROCEDURE:  Procedure(s): LAPAROSCOPIC CHOLECYSTECTOMY (N/A)  SURGEON:  Surgeon(s) and Role:    * Ricarda Frame, MD - Primary  PHYSICIAN ASSISTANT:   ASSISTANTS: none   ANESTHESIA:   general  EBL:  No intake/output data recorded.  BLOOD ADMINISTERED:none  DRAINS: none   LOCAL MEDICATIONS USED:  MARCAINE   , XYLOCAINE  and Amount: 19 ml  SPECIMEN:  Source of Specimen:  gallbladder  DISPOSITION OF SPECIMEN:  PATHOLOGY  COUNTS:  YES  TOURNIQUET:  * No tourniquets in log *  DICTATION: .Dragon Dictation  PLAN OF CARE: Discharge to home after PACU  PATIENT DISPOSITION:  PACU - hemodynamically stable.   Delay start of Pharmacological VTE agent (>24hrs) due to surgical blood loss or risk of bleeding: not applicable

## 2017-06-26 NOTE — H&P (View-Only) (Signed)
Patient ID: Margaret Hayden Adriance, female   DOB: 05/16/1981, 36 y.o.   MRN: [id number]  CC: Right upper quadrant pain  HPI Margaret Hayden Jeane is a 36 y.o. female who presents to clinic today for evaluation of right upper quadrant pain, nausea. Patient reports for the last year she's had an extensive GI workup for her nausea and right upper quadrant pain. She reports that the nausea sometimes his all the time but is worsened when she has the right upper quadrant abdominal pain. The pain is usually food related, especially when she eats fatty or fried foods. She has had an extensive workup to include endoscopies, ultrasound, HIDA scan, endoscopic ultrasound. The most recent of which showed the possibility of polyps within the gallbladder. The HIDA scan had a normal ejection fraction but caused her symptoms as well. Denies any current fevers, chills, chest pain, shortness of breath, diarrhea, constipation.  HPI  Past Medical History:  Diagnosis Date  . Anxiety   . Gallbladder polyp   . GERD (gastroesophageal reflux disease)   . Kidney stone    cyst on kidney right on scan about 1 month ago  . Unintentional weight loss     Past Surgical History:  Procedure Laterality Date  . COLONOSCOPY WITH PROPOFOL N/A 01/02/2017   Procedure: COLONOSCOPY WITH PROPOFOL;  Surgeon: Wyline MoodKiran Anna, MD;  [facility];  Service: Endoscopy;  Laterality: N/A;  . EUS N/A 05/29/2017   Procedure: FULL UPPER ENDOSCOPIC ULTRASOUND (EUS) RADIAL;  Surgeon: Doren CustardSpaete, Joshua P, MD;  Location: [facility] ENDOSCOPY;  Service: Gastroenterology;  Laterality: N/A;  . NO PAST SURGERIES      Family History  Problem Relation Age of Onset  . Gallbladder disease Mother   . Diabetes Mother   . Gallbladder disease Father   . Appendicitis Father   . Breast cancer Maternal Grandmother   . Colon cancer Paternal Grandfather   . Breast cancer Maternal Aunt     Social History Social History  Substance Use Topics  . Smoking status:  Former Smoker    Types: Cigarettes  . Smokeless tobacco: Never Used  . Alcohol use No    No Known Allergies  Current Outpatient Prescriptions  Medication Sig Dispense Refill  . amitriptyline (ELAVIL) 10 MG tablet TAKE 1 TABLET (10 MG TOTAL) BY MOUTH AT BEDTIME.  0  . hydrOXYzine (ATARAX/VISTARIL) 25 MG tablet Take 1 tablet (25 mg total) by mouth 2 (two) times daily as needed for anxiety. 60 tablet 0  . Norgestimate-Ethinyl Estradiol Triphasic (TRI-LO-MARZIA) 0.18/0.215/0.25 MG-25 MCG tab Take by mouth.    Marland Kitchen. omeprazole (PRILOSEC) 20 MG capsule Take by mouth.    . promethazine (PHENERGAN) 25 MG tablet TAKE 1 TABLET (25 MG TOTAL) BY MOUTH EVERY 6 (SIX) HOURS AS NEEDED FOR NAUSEA OR VOMITING. 30 tablet 0   No current facility-administered medications for this visit.      Review of Systems A Multi-point review of systems was asked and was negative except for the findings documented in the history of present illness  Physical Exam Blood pressure 128/90, pulse 84, temperature 98.3 F (36.8 C), temperature source Oral, weight 64.4 kg (142 lb), last menstrual period 05/29/2017, unknown if currently breastfeeding. CONSTITUTIONAL: No acute distress. EYES: Pupils are equal, round, and reactive to light, Sclera are non-icteric. EARS, NOSE, MOUTH AND THROAT: The oropharynx is clear. The oral mucosa is pink and moist. Hearing is intact to voice. LYMPH NODES:  Lymph nodes in the neck are normal. RESPIRATORY:  Lungs are clear. There  is normal respiratory effort, with equal breath sounds bilaterally, and without pathologic use of accessory muscles. CARDIOVASCULAR: Heart is regular without murmurs, gallops, or rubs. GI: The abdomen is soft, nontender, and nondistended. There are no palpable masses. There is no hepatosplenomegaly. There are normal bowel sounds in all quadrants. GU: Rectal deferred.   MUSCULOSKELETAL: Normal muscle strength and tone. No cyanosis or edema.   SKIN: Turgor is good and  there are no pathologic skin lesions or ulcers. NEUROLOGIC: Motor and sensation is grossly normal. Cranial nerves are grossly intact. PSYCH:  Oriented to person, place and time. Affect is normal.  Data Reviewed Images and labs reviewed. Labs are from 6 months ago but are all within normal limits. Gastric emptying study 6 months ago was normal. Ultrasound of the abdomen from 3 months ago failed to show any gallstones or signs of cholecystitis. HIDA scan from 2 months ago shows normal ejection fraction but caused her pain. Endoscopic ultrasound from earlier this month shows likely gallbladder polyps I have personally reviewed the patient's imaging, laboratory findings and medical records.    Assessment    Biliary colic    Plan    36 year old female with a history consistent with biliary colic. We discussed the risks and benefits of a laparoscopic cholecystectomy and possible cholangiogram including, but not limited to bleeding, infection, injury to surrounding structures such as the intestine or liver, bile leak, retained gallstones, need to convert to an open procedure, prolonged diarrhea, blood clots such as  DVT, common bile duct injury, anesthesia risks, and possible need for additional procedures.  The likelihood of improvement in symptoms and return to the patient's normal status is good. We discussed the typical post-operative recovery course. Patient voiced understanding and desires to proceed. Discussed the possibility of using the [employer] robotic system as well but the patient stated she wanted to the Price difference. We will tentatively book her for a robotic laparoscopic cholecystectomy for October 4 with the understanding that it may change if there is a huge Price difference.      Time spent with the patient was 45 minutes, with more than 50% of the time spent in face-to-face education, counseling and care coordination.     Ricarda Frame, MD FACS General Surgeon 06/04/2017,  3:34 PM

## 2017-06-26 NOTE — Transfer of Care (Signed)
Immediate Anesthesia Transfer of Care Note  Patient: Margaret Hayden  Procedure(s) Performed: LAPAROSCOPIC CHOLECYSTECTOMY (N/A )  Patient Location: PACU  Anesthesia Type:General  Level of Consciousness: awake  Airway & Oxygen Therapy: Patient Spontanous Breathing  Post-op Assessment: Report given to RN  Post vital signs: Reviewed  Last Vitals:  Vitals:   06/26/17 0621 06/26/17 0836  BP: 110/73 (!) 112/57  Pulse: 73 72  Resp: 16 18  Temp: (!) 36.3 C 36.6 C  SpO2: 100%     Last Pain:  Vitals:   06/26/17 0621  TempSrc: Tympanic  PainSc: 2          Complications: No apparent anesthesia complications

## 2017-06-26 NOTE — Discharge Instructions (Signed)
AMBULATORY SURGERY  DISCHARGE INSTRUCTIONS   1) The drugs that you were given will stay in your system until tomorrow so for the next 24 hours you should not:  A) Drive an automobile B) Make any legal decisions C) Drink any alcoholic beverage   2) You may resume regular meals tomorrow.  Today it is better to start with liquids and gradually work up to solid foods.  You may eat anything you prefer, but it is better to start with liquids, then soup and crackers, and gradually work up to solid foods.   3) Please notify your doctor immediately if you have any unusual bleeding, trouble breathing, redness and pain at the surgery site, drainage, fever, or pain not relieved by medication.   4) Additional Instructions:Slint abdominal incisions anytime you cough, sneeze or move with a towel or pillow.   Laparoscopic Cholecystectomy, Care After This sheet gives you information about how to care for yourself after your procedure. Your health care provider may also give you more specific instructions. If you have problems or questions, contact your health care provider. What can I expect after the procedure? After the procedure, it is common to have:  Pain at your incision sites. You will be given medicines to control this pain.  Mild nausea or vomiting.  Bloating and possible shoulder pain from the air-like gas that was used during the procedure.  Follow these instructions at home: Incision care   Follow instructions from your health care provider about how to take care of your incisions. Make sure you: ? Wash your hands with soap and water before you change your bandage (dressing). If soap and water are not available, use hand sanitizer. ? Change your dressing as told by your health care provider. Leave initial dressings in place for 48 hours. Then remove after showers and replace with large Band-Aids until the dressing is clean when changed. ? Leave stitches (sutures), skin glue, or  adhesive strips in place. These skin closures may need to be in place for 2 weeks or longer. If adhesive strip edges start to loosen and curl up, you may trim the loose edges. Do not remove adhesive strips completely unless your health care provider tells you to do that.  Do not take baths, swim, or use a hot tub until your health care provider approves. Ask your health care provider if you can take showers. You may only be allowed to take sponge baths for bathing. OK to shower in 24 hours. Dressings can get wet.  Check your incision area every day for signs of infection. Check for: ? More redness, swelling, or pain. ? More fluid or blood. ? Warmth. ? Pus or a bad smell. Activity  Do not drive or use heavy machinery while taking prescription pain medicine.  Do not lift anything that is heavier than 10 lb (4.5 kg) until your health care provider approves.  Do not play contact sports until your health care provider approves.  Do not drive for 24 hours if you were given a medicine to help you relax (sedative).  Rest as needed. Do not return to work or school until your health care provider approves. General instructions  Take over-the-counter and prescription medicines only as told by your health care provider.  To prevent or treat constipation while you are taking prescription pain medicine, your health care provider may recommend that you: ? Drink enough fluid to keep your urine clear or pale yellow. ? Take over-the-counter or prescription medicines. ? Eat  foods that are high in fiber, such as fresh fruits and vegetables, whole grains, and beans. ? Limit foods that are high in fat and processed sugars, such as fried and sweet foods. Contact a health care provider if:  You develop a rash.  You have more redness, swelling, or pain around your incisions.  You have more fluid or blood coming from your incisions.  Your incisions feel warm to the touch.  You have pus or a bad smell  coming from your incisions.  You have a fever.  One or more of your incisions breaks open. Get help right away if:  You have trouble breathing.  You have chest pain.  You have increasing pain in your shoulders.  You faint or feel dizzy when you stand.  You have severe pain in your abdomen.  You have nausea or vomiting that lasts for more than one day.  You have leg pain. This information is not intended to replace advice given to you by your health care provider. Make sure you discuss any questions you have with your health care provider. Document Released: 09/09/2005 Document Revised: 03/30/2016 Document Reviewed: 02/26/2016 Elsevier Interactive Patient Education  2017 [employer].

## 2017-06-27 ENCOUNTER — Telehealth: Payer: Self-pay

## 2017-06-27 LAB — SURGICAL PATHOLOGY

## 2017-06-27 NOTE — Telephone Encounter (Signed)
Post-op call made to patient at this time. Left a message for patient to call back with any questions or concerns that she may have. Reminded patient of post-op visit on 10/18 at 10AM.

## 2017-07-10 ENCOUNTER — Ambulatory Visit (INDEPENDENT_AMBULATORY_CARE_PROVIDER_SITE_OTHER): Payer: BLUE CROSS/BLUE SHIELD | Admitting: General Surgery

## 2017-07-10 ENCOUNTER — Encounter: Payer: Self-pay | Admitting: General Surgery

## 2017-07-10 VITALS — BP 102/70 | HR 76 | Temp 98.3°F | Wt 143.0 lb

## 2017-07-10 DIAGNOSIS — Z4889 Encounter for other specified surgical aftercare: Secondary | ICD-10-CM

## 2017-07-10 NOTE — Progress Notes (Signed)
Outpatient Surgical Follow Up  07/10/2017  Margaret Hayden is an 36 y.o. female.   Chief Complaint  Patient presents with  . Routine Post Op    Laparoscopic Cholecystectomy  06/26/17 Dr. Tonita CongWoodham    HPI: 36 year old female returns to clinic now 2 weeks status post laparoscopic cholecystectomy. Patient reports that she had some diarrhea postoperatively but has been returning to normal. She is eating well and otherwise has no complaints. She denies any fevers, chills, chest pain, short of breath, nausea, vomiting. She's been very happy with her surgical experience. The pain she was having before surgery is completely gone.  Past Medical History:  Diagnosis Date  . Anxiety   . Gallbladder polyp   . GERD (gastroesophageal reflux disease)   . History of kidney stones    H/O  . Kidney stone    cyst on kidney right on scan about 1 month ago  . Unintentional weight loss     Past Surgical History:  Procedure Laterality Date  . CHOLECYSTECTOMY N/A 06/26/2017   Procedure: LAPAROSCOPIC CHOLECYSTECTOMY;  Surgeon: Ricarda FrameWoodham, Darol Cush, MD;  Location: [facility] ORS;  Service: General;  Laterality: N/A;  . COLONOSCOPY WITH PROPOFOL N/A 01/02/2017   Procedure: COLONOSCOPY WITH PROPOFOL;  Surgeon: Wyline MoodKiran Anna, MD;  Location: [facility] ENDOSCOPY;  Service: Endoscopy;  Laterality: N/A;  . EUS N/A 05/29/2017   Procedure: FULL UPPER ENDOSCOPIC ULTRASOUND (EUS) RADIAL;  Surgeon: Doren CustardSpaete, Joshua P, MD;  Location: [facility] ENDOSCOPY;  Service: Gastroenterology;  Laterality: N/A;  . NO PAST SURGERIES      Family History  Problem Relation Age of Onset  . Gallbladder disease Mother   . Diabetes Mother   . Gallbladder disease Father   . Appendicitis Father   . Breast cancer Maternal Grandmother   . Colon cancer Paternal Grandfather   . Breast cancer Maternal Aunt     Social History:  reports that she quit smoking about 12 months ago. Her smoking use included Cigarettes. She has never used smokeless tobacco. She reports  that she drinks alcohol. She reports that she does not use drugs.  Allergies: No Known Allergies  Medications reviewed.    ROS A multipoint review of systems was completed, all pertinent positives and negatives are documented within the history of present illness and remainder are negative   BP 102/70   Pulse 76   Temp 98.3 F (36.8 C) (Oral)   Wt 64.9 kg (143 lb)   LMP 06/22/2017   BMI 24.55 kg/m   Physical Exam Gen.: No acute distress Chest: Clear to auscultation Heart: Regular rhythm Abdomen: Soft, nontender, nondistended. Well approximated laparoscopic incision sites on evidence of erythema or drainage.    No results found for this or any previous visit (from the past 48 hour(s)). No results found.  Assessment/Plan:  1. Aftercare following surgery 36 year old female status post laparoscopic cholecystectomy. Pathology reviewed with the patient. Discussed anticipated continued improvement and recovery. Provided with standard postoperative precautions. She voiced understanding and will follow-up in clinic on an as-needed basis.     Ricarda Frameharles Anesha Hackert, MD FACS General Surgeon  07/10/2017,10:10 AM

## 2017-07-10 NOTE — Patient Instructions (Signed)

## 2017-09-26 ENCOUNTER — Other Ambulatory Visit: Payer: Self-pay | Admitting: Gastroenterology

## 2017-10-06 ENCOUNTER — Other Ambulatory Visit: Payer: Self-pay

## 2017-10-27 ENCOUNTER — Encounter: Payer: Self-pay | Admitting: Primary Care

## 2017-11-03 ENCOUNTER — Ambulatory Visit (INDEPENDENT_AMBULATORY_CARE_PROVIDER_SITE_OTHER): Payer: BLUE CROSS/BLUE SHIELD | Admitting: Primary Care

## 2017-11-03 ENCOUNTER — Encounter: Payer: Self-pay | Admitting: Primary Care

## 2017-11-03 DIAGNOSIS — K219 Gastro-esophageal reflux disease without esophagitis: Secondary | ICD-10-CM | POA: Diagnosis not present

## 2017-11-03 MED ORDER — PANTOPRAZOLE SODIUM 40 MG PO TBEC: 40.0000 mg | DELAYED_RELEASE_TABLET | Freq: Every day | ORAL | 1 refills | Status: AC

## 2017-11-03 MED ORDER — PROMETHAZINE HCL 12.5 MG PO TABS: 12.5000 mg | ORAL_TABLET | Freq: Three times a day (TID) | ORAL | 0 refills | Status: AC | PRN

## 2017-11-03 NOTE — Patient Instructions (Signed)
Stop taking omeprazole 20 mg.  Start pantoprazole 40 mg tablets once daily for heartburn. We will continue this for two months then reduce to 20 mg.  Use the promethazine as needed for nausea.  Please update me in a few weeks.  It was a pleasure to see you today!   Food Choices for Gastroesophageal Reflux Disease, Adult When you have gastroesophageal reflux disease (GERD), the foods you eat and your eating habits are very important. Choosing the right foods can help ease your discomfort. What guidelines do I need to follow?  Choose fruits, vegetables, whole grains, and low-fat dairy products.  Choose low-fat meat, fish, and poultry.  Limit fats such as oils, salad dressings, butter, nuts, and avocado.  Keep a food diary. This helps you identify foods that cause symptoms.  Avoid foods that cause symptoms. These may be different for everyone.  Eat small meals often instead of 3 large meals a day.  Eat your meals slowly, in a place where you are relaxed.  Limit fried foods.  Cook foods using methods other than frying.  Avoid drinking alcohol.  Avoid drinking large amounts of liquids with your meals.  Avoid bending over or lying down until 2-3 hours after eating. What foods are not recommended? These are some foods and drinks that may make your symptoms worse: Vegetables Tomatoes. Tomato juice. Tomato and spaghetti sauce. Chili peppers. Onion and garlic. Horseradish. Fruits Oranges, grapefruit, and lemon (fruit and juice). Meats High-fat meats, fish, and poultry. This includes hot dogs, ribs, ham, sausage, salami, and bacon. Dairy Whole milk and chocolate milk. Sour cream. Cream. Butter. Ice cream. Cream cheese. Drinks Coffee and tea. Bubbly (carbonated) drinks or energy drinks. Condiments Hot sauce. Barbecue sauce. Sweets/Desserts Chocolate and cocoa. Donuts. Peppermint and spearmint. Fats and Oils High-fat foods. This includes Jamaica[place] fries and potato  chips. Other Vinegar. Strong spices. This includes black pepper, white pepper, red pepper, cayenne, curry powder, cloves, ginger, and chili powder. The items listed above may not be a complete list of foods and drinks to avoid. Contact your dietitian for more information. This information is not intended to replace advice given to you by your health care provider. Make sure you discuss any questions you have with your health care provider. Document Released: 03/10/2012 Document Revised: 02/15/2016 Document Reviewed: 07/14/2013 Elsevier Interactive Patient Education  2017 [employer]

## 2017-11-03 NOTE — Assessment & Plan Note (Signed)
Symptoms since removal of gallbladder in October 2018. Uncontrolled on omeprazole 20 mg BID. Long discussion regarding use of PPI's long term and the risks.   Will switch to pantoprazole 40 mg for 2 months, then reduce down from there. Refill provided for promethazine. She will update us in 2-3 weeks.

## 2017-11-03 NOTE — Progress Notes (Signed)
Subjective:    Patient ID: Margaret Hayden, female    DOB: August 17, 1981, 37 y.o.   MRN: [id number]  HPI  Ms. Arnall is a 37 year old female with a history of chronic abdominal pain, biliary colic, recent cholecystectomy) , hital hernia who presents today with a chief complaint of heartburn.  She underwent surgery (cholecystectomy) in October 2018 and since then has noticed constant epigastric discomfort, nausea, and esophageal burning. She's been taking OTC Prilosec twice daily since her surgery. Sometimes she'll experience nausea even when on Prilosec. She'll take Tums twice weekly on average and promethazine once weekly on average. She tries to avoid acidic foods and laying down within 2 hours after eating.   She denies fevers, vomiting, chills, decrease in appetite.   Review of Systems  Constitutional: Negative for fever.  Gastrointestinal: Positive for nausea. Negative for abdominal pain and vomiting.       GERD       Past Medical History:  Diagnosis Date  . Anxiety   . Gallbladder polyp   . GERD (gastroesophageal reflux disease)   . History of kidney stones    H/O  . Kidney stone    cyst on kidney right on scan about 1 month ago  . Unintentional weight loss      Social History   Socioeconomic History  . Marital status: Married    Spouse name: Not on file  . Number of children: Not on file  . Years of education: Not on file  . Highest education level: Not on file  Social Needs  . Financial resource strain: Not on file  . Food insecurity - worry: Not on file  . Food insecurity - inability: Not on file  . Transportation needs - medical: Not on file  . Transportation needs - non-medical: Not on file  Occupational History  . Not on file  Tobacco Use  . Smoking status: Former Smoker    Types: Cigarettes    Last attempt to quit: 06/19/2016    Years since quitting: 1.3  . Smokeless tobacco: Never Used  . Tobacco comment: ONLY SMOKED SOCIALLY  Substance and Sexual  Activity  . Alcohol use: Yes    Comment: OCC  . Drug use: No  . Sexual activity: Yes  Other Topics Concern  . Not on file  Social History Narrative   Married.   2 children.   Works as a Futures trader.   Enjoys reading, drawing/painting, spending time with family.    Past Surgical History:  Procedure Laterality Date  . CHOLECYSTECTOMY N/A 06/26/2017   Procedure: LAPAROSCOPIC CHOLECYSTECTOMY;  Surgeon: Ricarda Frame, MD;  Location: [facility] ORS;  Service: General;  Laterality: N/A;  . COLONOSCOPY WITH PROPOFOL N/A 01/02/2017   Procedure: COLONOSCOPY WITH PROPOFOL;  Surgeon: Wyline Mood, MD;  Location: [facility] ENDOSCOPY;  Service: Endoscopy;  Laterality: N/A;  . EUS N/A 05/29/2017   Procedure: FULL UPPER ENDOSCOPIC ULTRASOUND (EUS) RADIAL;  Surgeon: Doren Custard, MD;  Location: [facility] ENDOSCOPY;  Service: Gastroenterology;  Laterality: N/A;  . NO PAST SURGERIES      Family History  Problem Relation Age of Onset  . Gallbladder disease Mother   . Diabetes Mother   . Gallbladder disease Father   . Appendicitis Father   . Breast cancer Maternal Grandmother   . Colon cancer Paternal Grandfather   . Breast cancer Maternal Aunt     No Known Allergies  Current Outpatient Medications on File Prior to Visit  Medication Sig Dispense Refill  .  amitriptyline (ELAVIL) 10 MG tablet TAKE 1 TABLET (10 MG TOTAL) BY MOUTH AT BEDTIME.  0  . L-Lysine 1000 MG TABS     . Multiple Vitamins-Minerals (MULTIVITAMIN WITH MINERALS) tablet Take 1 tablet by mouth daily.    . Norgestimate-Ethinyl Estradiol Triphasic (TRI-LO-MARZIA) 0.18/0.215/0.25 MG-25 MCG tab Take by mouth.    Marland Kitchen. omeprazole (PRILOSEC) 20 MG capsule Take 20 mg by mouth daily as needed.     . Probiotic Product (PROBIOTIC DAILY PO) Take 1 capsule by mouth daily.    . vitamin C (ASCORBIC ACID) 500 MG tablet Take 500 mg by mouth 2 (two) times a week.     No current facility-administered medications on file prior to visit.     BP 120/76   Pulse 80    Temp 98.1 F (36.7 C) (Oral)   Ht 5\' 4"  (1.626 m)   Wt 148 lb (67.1 kg)   LMP 10/12/2017   SpO2 99%   BMI 25.40 kg/m    Objective:   Physical Exam  Constitutional: She appears well-nourished.  Neck: Neck supple.  Cardiovascular: Normal rate and regular rhythm.  Pulmonary/Chest: Effort normal and breath sounds normal.  Abdominal: Soft. Bowel sounds are normal. There is no tenderness.  Skin: Skin is warm and dry.          Assessment & Plan:

## 2017-12-28 ENCOUNTER — Other Ambulatory Visit: Payer: Self-pay | Admitting: Primary Care

## 2017-12-28 DIAGNOSIS — K219 Gastro-esophageal reflux disease without esophagitis: Secondary | ICD-10-CM

## 2017-12-29 NOTE — Telephone Encounter (Signed)
Ok to refill? Electronically refill request for pantoprazole (PROTONIX) 40 MG tablet  Last prescribed and seen on 11/03/2017.

## 2017-12-30 NOTE — Telephone Encounter (Signed)
How's she doing on pantoprazole 40 mg? Any symptoms? If not then I'd like to see if we can get her to 20 mg once daily as 40 mg is very high.

## 2017-12-31 NOTE — Telephone Encounter (Signed)
Per DPR, left detail message of Kate Clark's comments for patient to call back 

## 2018-01-05 ENCOUNTER — Encounter: Payer: Self-pay | Admitting: Primary Care

## 2018-01-05 NOTE — Telephone Encounter (Signed)
Per DPR, left detail message of Kate Clark's comments for patient to call back 

## 2018-01-07 NOTE — Telephone Encounter (Signed)
Sent patient a message through My Chart.

## 2018-01-12 NOTE — Telephone Encounter (Signed)
I spoke with patient via My Chart on 01/05/18, will discontinue Rx. Thanks.

## 2018-01-12 NOTE — Telephone Encounter (Signed)
Jae DireKate, I know this late, I have trying to call this patient with no response. Please let me know what you want me to do.

## 2018-02-02 ENCOUNTER — Ambulatory Visit (INDEPENDENT_AMBULATORY_CARE_PROVIDER_SITE_OTHER): Payer: BLUE CROSS/BLUE SHIELD | Admitting: Gastroenterology

## 2018-02-02 DIAGNOSIS — K219 Gastro-esophageal reflux disease without esophagitis: Secondary | ICD-10-CM | POA: Diagnosis not present

## 2018-02-02 NOTE — Progress Notes (Signed)
Wyline Mood MD, MRCP(U.K) [address]  [address]  [place], [place] [address]  Main: [phone]  Fax: [phone]   Primary Care Physician: Doreene Nest, NP  Primary Gastroenterologist:  Dr. Wyline Mood   No chief complaint on file.   HPI: Margaret Hayden is a 37 y.o. female   Summary of history : She was initially seen in 10/2016 referred for RUQ pain.  She has previously been a patient of the [facility] . She saw me for a second opinion. Prior evaluation has been a normal EGD in 2017 , normal ultrasound, HIDA scan with EF of 46 %. Concern of weight loss of >100 lbs . H pylori breath test, LFT's negative in 07/2016 .celiac serology has also been negative in the past There has been concern that it may have been due to stress . It appears she was a patient of Dr Christella Hartigan in the past , then switched to Dr Ewing Schlein and then Dr Markham Jordan. Abdominal pain RUQ was within 30 mins of a meal , sharp in nature.I referred her for an EUS and she was found to have gall bladder polyps.  I felt the pain was from her gall bladder and she underwent a cholecystectomy in 2018 with resolution of her pain.   Normal gastric emptying study.  Normal colonoscopy 12/2016  Interval history  2018-01/2018   She has been referred for heartburn at this time. At this time she says that sh ehas had some burning sensation at the top stomach , points to the epigastrium, lot of bloating , something at the back of her throat . Prilosec BID , on an empty stomach . Helped a bit. No change in weight . This pain is not similar to what she had before surgery. Presently she is taking Prilsoec , probiotic, multivitamin. L lysine.   Discomfort is worse when she lies down , when she sits propped symptoms are better. Usually worse at night right after her meals.    Current Outpatient Medications  Medication Sig Dispense Refill  . amitriptyline (ELAVIL) 10 MG tablet TAKE 1 TABLET (10 MG TOTAL) BY MOUTH AT  BEDTIME.  0  . L-Lysine 1000 MG TABS     . Multiple Vitamins-Minerals (MULTIVITAMIN WITH MINERALS) tablet Take 1 tablet by mouth daily.    . Norgestimate-Ethinyl Estradiol Triphasic (TRI-LO-MARZIA) 0.18/0.215/0.25 MG-25 MCG tab Take by mouth.    . pantoprazole (PROTONIX) 40 MG tablet Take 1 tablet (40 mg total) by mouth daily. 30 tablet 1  . Probiotic Product (PROBIOTIC DAILY PO) Take 1 capsule by mouth daily.    . promethazine (PHENERGAN) 12.5 MG tablet Take 1 tablet (12.5 mg total) by mouth every 8 (eight) hours as needed for nausea or vomiting. 20 tablet 0  . vitamin C (ASCORBIC ACID) 500 MG tablet Take 500 mg by mouth 2 (two) times a week.     No current facility-administered medications for this visit.     Allergies as of 02/02/2018  . (No Known Allergies)    ROS:  General: Negative for anorexia, weight loss, fever, chills, fatigue, weakness. ENT: Negative for hoarseness, difficulty swallowing , nasal congestion. CV: Negative for chest pain, angina, palpitations, dyspnea on exertion, peripheral edema.  Respiratory: Negative for dyspnea at rest, dyspnea on exertion, cough, sputum, wheezing.  GI: See history of present illness. GU:  Negative for dysuria, hematuria, urinary incontinence, urinary frequency, nocturnal urination.  Endo: Negative for unusual weight change.    Physical Examination:   There  were no vitals taken for this visit.  General: Well-nourished, well-developed in no acute distress.  Eyes: No icterus. Conjunctivae pink. Mouth: Oropharyngeal mucosa moist and pink , no lesions erythema or exudate. Lungs: Clear to auscultation bilaterally. Non-labored. Heart: Regular rate and rhythm, no murmurs rubs or gallops.  Abdomen: Bowel sounds are normal, nontender, nondistended, no hepatosplenomegaly or masses, no abdominal bruits or hernia , no rebound or guarding.   Extremities: No lower extremity edema. No clubbing or deformities. Neuro: Alert and oriented x 3.  Grossly  intact. Skin: Warm and dry, no jaundice.   Psych: Alert and cooperative, normal mood and affect.   Imaging Studies: No results found.  Assessment and Plan:   Margaret Hayden is a 37 y.o. y/o female here to see me to day for GERD which has developed since her cholecystectomy. Suspect acid and non acid reflux.   Plan  1. Trial of Dexilant 2 weeks samples provided -( this medication is not affected by the way we eat our meals) . 2. Wedge pillow 3. If no better in 2 weeks may perform EGD+/- impedence Ph testing vs empiric rx with abile salt binder like questran .    Dr Wyline Mood  MD,MRCP [facility]) Follow up in 12 weeks if not doing any better.

## 2018-02-13 ENCOUNTER — Encounter: Payer: Self-pay | Admitting: Gastroenterology

## 2018-02-17 ENCOUNTER — Other Ambulatory Visit: Payer: Self-pay

## 2018-02-17 DIAGNOSIS — K219 Gastro-esophageal reflux disease without esophagitis: Secondary | ICD-10-CM

## 2018-02-17 MED ORDER — DEXLANSOPRAZOLE 60 MG PO CPDR: 60.0000 mg | DELAYED_RELEASE_CAPSULE | Freq: Every day | ORAL | 3 refills | Status: DC

## 2018-02-19 ENCOUNTER — Other Ambulatory Visit: Payer: Self-pay

## 2018-03-01 ENCOUNTER — Encounter: Payer: Self-pay | Admitting: Gastroenterology

## 2018-05-05 ENCOUNTER — Ambulatory Visit: Payer: BLUE CROSS/BLUE SHIELD | Admitting: Gastroenterology

## 2018-09-04 IMAGING — NM NM GASTRIC EMPTYING
6 series · 20 of 20 positions shown · non-contrast
Comparison: None

CLINICAL DATA: Nausea and RIGHT upper abdominal pain for 7-8
months, pain starting after eating, some vomiting, history GERD

EXAM:
NUCLEAR MEDICINE GASTRIC EMPTYING SCAN
TECHNIQUE: After oral ingestion of radiolabeled meal, sequential abdominal
images were obtained for 4 hours. Percentage of activity emptying
the stomach was calculated at 1 hour, 2 hour, 3 hour, and 4 hours.
RADIOPHARMACEUTICALS:  2.9 mCi Dc-RRm sulfur colloid in standardized
meal

[Series 1000: gastric statics · 3.90mm/px · 2 of 2 frames shown (1 of 3)]
[frame 1/2]
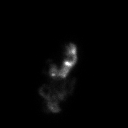
[frame 2/2]
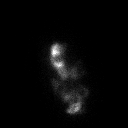

[Series 1000: gatric statics · 3.90mm/px · 2 of 2 frames shown (1 of 2)]
[frame 1/2]
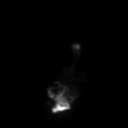
[frame 2/2]
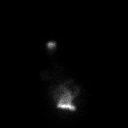

[Series 1000: gastric statics · 3.90mm/px · 2 of 2 frames shown (2 of 3)]
[frame 1/2]
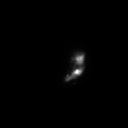
[frame 2/2]
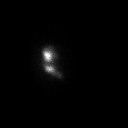

[Series 1000: gatric statics · 3.90mm/px · 2 of 2 frames shown (2 of 2)]
[frame 1/2]
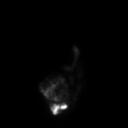
[frame 2/2]
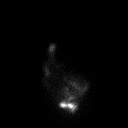

[Series 1000: gastric statics · 3.90mm/px · 2 of 2 frames shown (3 of 3)]
[frame 1/2]
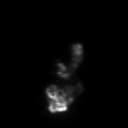
[frame 2/2]
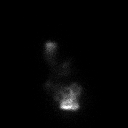

[Series 1000: gatric statics (results) · 3.90mm/px · 5 acquisitions, 10 frames shown]
[im 1/5]
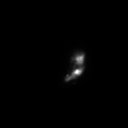
[im 1/5]
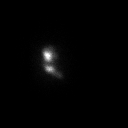
[im 2/5]
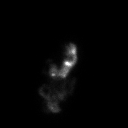
[im 2/5]
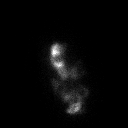
[im 3/5]
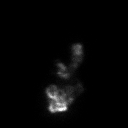
[im 3/5]
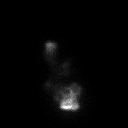
[im 4/5]
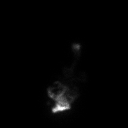
[im 4/5]
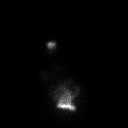
[im 5/5]
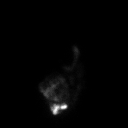
[im 5/5]
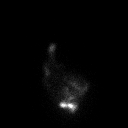

[20 of 20 positions shown; findings below may reference images not displayed]

FINDINGS: Expected location of the stomach in the left upper quadrant.

Ingested meal empties the stomach gradually over the course of the
study.

34% emptied at 1 hr ( normal >= 10%)

77% emptied at 2 hr ( normal >= 40%)

92% emptied at 3 hr ( normal >= 70%)

96% emptied at 4 hr ( normal >= 90%)
IMPRESSION: Normal gastric emptying study.

## 2019-02-26 ENCOUNTER — Encounter: Payer: Self-pay | Admitting: Primary Care

## 2019-02-26 ENCOUNTER — Ambulatory Visit (INDEPENDENT_AMBULATORY_CARE_PROVIDER_SITE_OTHER): Payer: BLUE CROSS/BLUE SHIELD | Admitting: Primary Care

## 2019-02-26 DIAGNOSIS — R6881 Early satiety: Secondary | ICD-10-CM | POA: Insufficient documentation

## 2019-02-26 NOTE — Assessment & Plan Note (Signed)
Differentials include GERD, gastroparesis, anxiety. Less likely pancreatitis.  Recommended she add in Pepcid daily, also to avoid starchy/spicy/acidic/fatty food that would cause increase in fullness. Recommend she call her GI doctor for further advice.   Exam and HPI are reassuring. She does not seem to be experiencing a blockage or any alarm signs.

## 2019-02-26 NOTE — Progress Notes (Signed)
Subjective:    Patient ID: Margaret Hayden, female    DOB: [birth year], 38 y.o.   MRN: [id number]  HPI  Virtual Visit via Video Note  I connected with Margaret Hayden on 02/26/19 at 10:40 AM EDT by a video enabled telemedicine application and verified that I am speaking with the correct person using two identifiers.  Location: Patient: Home Provider: Office   I discussed the limitations of evaluation and management by telemedicine and the availability of in person appointments. The patient expressed understanding and agreed to proceed.  History of Present Illness:  Margaret Hayden is a 38 year old female with a history of GERD, cholecystectomy, nausea, biliary colic who presents today with a chief complaint of early satiety  About one month ago she started noticing early satiety with very small meals. She also noticed epigastric discomfort, bloating, feels like she can't get a deep breath. Yesterday she developed bilateral upper abdominal pain with shortness of breath which has improved. She is compliant to her Dexilant as prescribed and denies breakthrough GERD symptoms. She has lost 8 pounds over the last week, yesterday she ate a bowl of cereal in the morning and half an avocado last night. She denies nausea, vomiting, constipation, diarrhea. Bowel movements are daily and she is passing gas. She has an appointment with her GI doctor on 03/10/19.  She underwent upper and lower endoscopy in 2018, upper endoscopy showed small hiatal hernia.    Observations/Objective:  Alert and oriented. Appears well, not sickly. No distress. Speaking in complete sentences.   Assessment and Plan:  Differentials include GERD, gastroparesis, anxiety. Less likely pancreatitis.  Recommended she add in Pepcid daily, also to avoid starchy/spicy/acidic/fatty food that would cause increase in fullness. Recommend she call her GI doctor for further advice.   Exam and HPI are reassuring. She does not  seem to be experiencing a blockage or any alarm signs.   Follow Up Instructions:  Call your GI doctor as discussed.  Try adding in Pepcid once daily as discussed.  Limit starchy/fried/acidic/spicy food and dairy for now.  Please notify me if your symptoms progress.  It was a pleasure to see you today! Mayra Reel, NP-C    I discussed the assessment and treatment plan with the patient. The patient was provided an opportunity to ask questions and all were answered. The patient agreed with the plan and demonstrated an understanding of the instructions.   The patient was advised to call back or seek an in-person evaluation if the symptoms worsen or if the condition fails to improve as anticipated.     Doreene Nest, NP    Review of Systems  Constitutional: Negative for fever.  Respiratory: Negative for shortness of breath.   Gastrointestinal: Negative for abdominal pain, constipation, diarrhea, nausea and vomiting.       Bloating, early satiety       Past Medical History:  Diagnosis Date  . Anxiety   . Gallbladder polyp   . GERD (gastroesophageal reflux disease)   . History of kidney stones    H/O  . Kidney stone    cyst on kidney right on scan about 1 month ago  . Unintentional weight loss      Social History   Socioeconomic History  . Marital status: Married    Spouse name: Not on file  . Number of children: Not on file  . Years of education: Not on file  . Highest education level: Not on file  Occupational  History  . Not on file  Social Needs  . Financial resource strain: Not on file  . Food insecurity:    Worry: Not on file    Inability: Not on file  . Transportation needs:    Medical: Not on file    Non-medical: Not on file  Tobacco Use  . Smoking status: Former Smoker    Types: Cigarettes    Last attempt to quit: 06/19/2016    Years since quitting: 2.6  . Smokeless tobacco: Never Used  . Tobacco comment: ONLY SMOKED SOCIALLY  Substance and  Sexual Activity  . Alcohol use: Yes    Comment: OCC  . Drug use: No  . Sexual activity: Yes  Lifestyle  . Physical activity:    Days per week: Not on file    Minutes per session: Not on file  . Stress: Not on file  Relationships  . Social connections:    Talks on phone: Not on file    Gets together: Not on file    Attends religious service: Not on file    Active member of club or organization: Not on file    Attends meetings of clubs or organizations: Not on file    Relationship status: Not on file  . Intimate partner violence:    Fear of current or ex partner: Not on file    Emotionally abused: Not on file    Physically abused: Not on file    Forced sexual activity: Not on file  Other Topics Concern  . Not on file  Social History Narrative   Married.   2 children.   Works as a Futures traderHomemaker.   Enjoys reading, drawing/painting, spending time with family.    Past Surgical History:  Procedure Laterality Date  . CHOLECYSTECTOMY N/A 06/26/2017   Procedure: LAPAROSCOPIC CHOLECYSTECTOMY;  Surgeon: Ricarda FrameWoodham, Charles, MD;  Location: [facility] ORS;  Service: General;  Laterality: N/A;  . COLONOSCOPY WITH PROPOFOL N/A 01/02/2017   Procedure: COLONOSCOPY WITH PROPOFOL;  Surgeon: Wyline MoodKiran Anna, MD;  Location: [facility] ENDOSCOPY;  Service: Endoscopy;  Laterality: N/A;  . EUS N/A 05/29/2017   Procedure: FULL UPPER ENDOSCOPIC ULTRASOUND (EUS) RADIAL;  Surgeon: Doren CustardSpaete, Joshua P, MD;  Location: [facility] ENDOSCOPY;  Service: Gastroenterology;  Laterality: N/A;  . NO PAST SURGERIES      Family History  Problem Relation Age of Onset  . Gallbladder disease Mother   . Diabetes Mother   . Gallbladder disease Father   . Appendicitis Father   . Breast cancer Maternal Grandmother   . Colon cancer Paternal Grandfather   . Breast cancer Maternal Aunt     No Known Allergies  Current Outpatient Medications on File Prior to Visit  Medication Sig Dispense Refill  . amitriptyline (ELAVIL) 10 MG tablet TAKE 1 TABLET  (10 MG TOTAL) BY MOUTH AT BEDTIME.  0  . dexlansoprazole (DEXILANT) 60 MG capsule Take 1 capsule (60 mg total) by mouth daily. 90 capsule 3  . L-Lysine 1000 MG TABS     . Multiple Vitamins-Minerals (MULTIVITAMIN WITH MINERALS) tablet Take 1 tablet by mouth daily.    . Norgestimate-Ethinyl Estradiol Triphasic (TRI-LO-MARZIA) 0.18/0.215/0.25 MG-25 MCG tab Take by mouth.    . pantoprazole (PROTONIX) 40 MG tablet Take 1 tablet (40 mg total) by mouth daily. 30 tablet 1  . Probiotic Product (PROBIOTIC DAILY PO) Take 1 capsule by mouth daily.    . vitamin C (ASCORBIC ACID) 500 MG tablet Take 500 mg by mouth 2 (two) times a week.    .Marland Kitchen  promethazine (PHENERGAN) 12.5 MG tablet Take 1 tablet (12.5 mg total) by mouth every 8 (eight) hours as needed for nausea or vomiting. (Patient not taking: Reported on 02/26/2019) 20 tablet 0   No current facility-administered medications on file prior to visit.     Wt 155 lb (70.3 kg)   LMP 02/26/2019   BMI 26.61 kg/m    Objective:   Physical Exam  Constitutional: She is oriented to person, place, and time. She appears well-nourished. She does not have a sickly appearance. She does not appear ill.  Respiratory: Effort normal.  Neurological: She is alert and oriented to person, place, and time.  Psychiatric:  Appears slightly anxious regarding symptoms.            Assessment & Plan:

## 2019-02-26 NOTE — Patient Instructions (Signed)
Call your GI doctor as discussed.  Try adding in Pepcid once daily as discussed.  Limit starchy/fried/acidic/spicy food and dairy for now.  Please notify me if your symptoms progress.  It was a pleasure to see you today! Mayra Reel, NP-C

## 2019-03-10 ENCOUNTER — Other Ambulatory Visit: Payer: Self-pay

## 2019-03-10 ENCOUNTER — Encounter: Payer: Self-pay | Admitting: Gastroenterology

## 2019-03-10 ENCOUNTER — Ambulatory Visit (INDEPENDENT_AMBULATORY_CARE_PROVIDER_SITE_OTHER): Payer: BLUE CROSS/BLUE SHIELD | Admitting: Gastroenterology

## 2019-03-10 VITALS — BP 125/89 | HR 91 | Temp 98.2°F | Ht 64.0 in | Wt 157.6 lb

## 2019-03-10 DIAGNOSIS — R140 Abdominal distension (gaseous): Secondary | ICD-10-CM

## 2019-03-10 MED ORDER — RIFAXIMIN 550 MG PO TABS: 550.0000 mg | ORAL_TABLET | Freq: Two times a day (BID) | ORAL | 0 refills | Status: AC

## 2019-03-10 NOTE — Progress Notes (Signed)
Jonathon Bellows MD, MRCP(U.K) [address]  [place]  [place], [place] [address]  Main: [phone]  Fax: [phone]   Primary Care Physician: Pleas Koch, NP  Primary Gastroenterologist:  Dr. Jonathon Bellows   No chief complaint on file.   HPI: Margaret Hayden is a 38 y.o. female   Summary of history : She was initially seen in 10/2016 referred for RUQ pain. She has previously been a patient of the [facility] . She saw me for a second opinion. Prior evaluation has been a normal EGD in 2017 , normal ultrasound, HIDA scan with EF of 46 %. Concern of weight loss of >100 lbs . H pylori breath test, LFT's negative in 07/2016 .celiac serology has also been negative in the past There has been concern that it may have been due to stress . It appears she was a patient of Dr Ardis Hughs in the past , then switched to Dr Watt Climes and then Dr Tiffany Kocher.Abdominal pain RUQ was within 30 mins of a meal , sharp in nature.I referred her for an EUS and she was found to have gall bladder polyps.  I felt the pain was from her gall bladder and she underwent a cholecystectomy in 2018 with resolution of her pain. Referred back for heartburn in 01/2018  Normal gastric emptying study.  Normal colonoscopy 12/2016  Interval history5/2019 -03/10/2019  She presented to Pleas Koch, NP with early satiety , epigastric discomfort and bloating, referred back to see me.    Since beginnig of may developed bloating after she eats , makes her uncomfortable, occurs everytime she eats, abdominal distension. Not burping a lot , Stays all day. Not much rumbling or gurgling sounds. No artificial sugars in her diet .      Current Outpatient Medications  Medication Sig Dispense Refill  . amitriptyline (ELAVIL) 10 MG tablet TAKE 1 TABLET (10 MG TOTAL) BY MOUTH AT BEDTIME.  0  . dexlansoprazole (DEXILANT) 60 MG capsule Take 1 capsule (60 mg total) by mouth daily. 90 capsule 3  . L-Lysine 1000 MG TABS      . Multiple Vitamins-Minerals (MULTIVITAMIN WITH MINERALS) tablet Take 1 tablet by mouth daily.    . Norgestimate-Ethinyl Estradiol Triphasic (TRI-LO-MARZIA) 0.18/0.215/0.25 MG-25 MCG tab Take by mouth.    . pantoprazole (PROTONIX) 40 MG tablet Take 1 tablet (40 mg total) by mouth daily. 30 tablet 1  . Probiotic Product (PROBIOTIC DAILY PO) Take 1 capsule by mouth daily.    . promethazine (PHENERGAN) 12.5 MG tablet Take 1 tablet (12.5 mg total) by mouth every 8 (eight) hours as needed for nausea or vomiting. (Patient not taking: Reported on 02/26/2019) 20 tablet 0  . vitamin C (ASCORBIC ACID) 500 MG tablet Take 500 mg by mouth 2 (two) times a week.     No current facility-administered medications for this visit.     Allergies as of 03/10/2019  . (No Known Allergies)    ROS:  General: Negative for anorexia, weight loss, fever, chills, fatigue, weakness. ENT: Negative for hoarseness, difficulty swallowing , nasal congestion. CV: Negative for chest pain, angina, palpitations, dyspnea on exertion, peripheral edema.  Respiratory: Negative for dyspnea at rest, dyspnea on exertion, cough, sputum, wheezing.  GI: See history of present illness. GU:  Negative for dysuria, hematuria, urinary incontinence, urinary frequency, nocturnal urination.  Endo: Negative for unusual weight change.    Physical Examination:   LMP 02/26/2019   General: Well-nourished, well-developed in no acute distress.  Eyes: No  icterus. Conjunctivae pink. Mouth: Oropharyngeal mucosa moist and pink , no lesions erythema or exudate. Lungs: Clear to auscultation bilaterally. Non-labored. Heart: Regular rate and rhythm, no murmurs rubs or gallops.  Abdomen: Bowel sounds are normal, nontender, nondistended, no hepatosplenomegaly or masses, no abdominal bruits or hernia , no rebound or guarding.   Extremities: No lower extremity edema. No clubbing or deformities. Neuro: Alert and oriented x 3.  Grossly intact. Skin: Warm and  dry, no jaundice.   Psych: Alert and cooperative, normal mood and affect.   Imaging Studies: No results found.  Assessment and Plan:   Margaret Hayden is a 38 y.o. y/o female here to see me for abdominal discomfort,bloating. Features suggestive of SIBO  Plan  1. Low FODMAP diet  2. Trial of xifaxan for 2 weeks  Dr Wyline MoodKiran Jaxiel Kines  MD,MRCP [facility](U.K) Follow up in 3-4 weeks

## 2019-03-10 NOTE — Addendum Note (Signed)
Addended by: Dorethea Clan on: 03/10/2019 03:47 PM   Modules accepted: Orders

## 2019-04-06 ENCOUNTER — Other Ambulatory Visit: Payer: Self-pay

## 2019-04-06 ENCOUNTER — Encounter: Payer: Self-pay | Admitting: Gastroenterology

## 2019-04-06 ENCOUNTER — Ambulatory Visit (INDEPENDENT_AMBULATORY_CARE_PROVIDER_SITE_OTHER): Payer: BLUE CROSS/BLUE SHIELD | Admitting: Gastroenterology

## 2019-04-06 VITALS — BP 110/73 | HR 85 | Temp 98.7°F | Ht 64.0 in | Wt 160.8 lb

## 2019-04-06 DIAGNOSIS — R140 Abdominal distension (gaseous): Secondary | ICD-10-CM

## 2019-04-06 DIAGNOSIS — K6389 Other specified diseases of intestine: Secondary | ICD-10-CM | POA: Diagnosis not present

## 2019-04-06 NOTE — Progress Notes (Signed)
Jonathon Bellows MD, MRCP(U.K) [address]  [place]  [place], [place] [address]  Main: [phone]  Fax: [phone]   Primary Care Physician: Pleas Koch, NP  Primary Gastroenterologist:  Dr. Jonathon Bellows   Follow-up to her last visit for bloating  HPI: Margaret Hayden is a 38 y.o. female   Summary of history : She was initially seen in 10/2016 referred for RUQ pain. She has previously been a patient of the [facility] . Shesaw me for a second opinion. Prior evaluation has been a normal EGD in 2017 , normal ultrasound, HIDA scan with EF of 46 %. Concern of weight loss of >100 lbs . H pylori breath test, LFT's negative in 07/2016 .celiac serology has also been negative in the past There has been concern that it may have been due to stress . It appears she was a patient of Dr Ardis Hughs in the past , then switched to Dr Watt Climes and then Dr Tiffany Kocher.Abdominal pain RUQ was within 30 mins of a meal , sharp in nature.I referred her for an EUS and she was found to have gall bladder polyps.I felt the pain was from her gall bladder and she underwent a cholecystectomy in 2018 with resolution of her pain. Referred back for heartburn in 01/2018  Normal gastric emptying study.  Normal colonoscopy 12/2016  Interval history6/17/2020-04/06/2019  At her last visit she was seen for bloating and affected with follow-up due to small bowel and distention overgrowth syndrome treated with a two-week course of Xifaxan which significantly helped her but not completely she feels a slightly longer duration of antibiotics with helped her completely.    Current Outpatient Medications  Medication Sig Dispense Refill  . amitriptyline (ELAVIL) 10 MG tablet TAKE 1 TABLET (10 MG TOTAL) BY MOUTH AT BEDTIME.  0  . dexlansoprazole (DEXILANT) 60 MG capsule Take 1 capsule (60 mg total) by mouth daily. 90 capsule 3  . L-Lysine 1000 MG TABS     . Multiple Vitamins-Minerals (MULTIVITAMIN WITH  MINERALS) tablet Take 1 tablet by mouth daily.    . Norgestimate-Ethinyl Estradiol Triphasic (TRI-LO-MARZIA) 0.18/0.215/0.25 MG-25 MCG tab Take by mouth.    . pantoprazole (PROTONIX) 40 MG tablet Take 1 tablet (40 mg total) by mouth daily. (Patient not taking: Reported on 03/10/2019) 30 tablet 1  . Probiotic Product (PROBIOTIC DAILY PO) Take 1 capsule by mouth daily.    . promethazine (PHENERGAN) 12.5 MG tablet Take 1 tablet (12.5 mg total) by mouth every 8 (eight) hours as needed for nausea or vomiting. (Patient not taking: Reported on 02/26/2019) 20 tablet 0  . vitamin C (ASCORBIC ACID) 500 MG tablet Take 500 mg by mouth 2 (two) times a week.     No current facility-administered medications for this visit.     Allergies as of 04/06/2019  . (No Known Allergies)    ROS:  General: Negative for anorexia, weight loss, fever, chills, fatigue, weakness. ENT: Negative for hoarseness, difficulty swallowing , nasal congestion. CV: Negative for chest pain, angina, palpitations, dyspnea on exertion, peripheral edema.  Respiratory: Negative for dyspnea at rest, dyspnea on exertion, cough, sputum, wheezing.  GI: See history of present illness. GU:  Negative for dysuria, hematuria, urinary incontinence, urinary frequency, nocturnal urination.  Endo: Negative for unusual weight change.    Physical Examination:   There were no vitals taken for this visit.  General: Well-nourished, well-developed in no acute distress.  Eyes: No icterus. Conjunctivae pink. Mouth: Oropharyngeal mucosa moist and pink ,  no lesions erythema or exudate. Lungs: Clear to auscultation bilaterally. Non-labored. Heart: Regular rate and rhythm, no murmurs rubs or gallops.  Abdomen: Bowel sounds are normal, nontender, nondistended, no hepatosplenomegaly or masses, no abdominal bruits or hernia , no rebound or guarding.   Extremities: No lower extremity edema. No clubbing or deformities. Neuro: Alert and oriented x 3.  Grossly  intact. Skin: Warm and dry, no jaundice.   Psych: Alert and cooperative, normal mood and affect.   Imaging Studies: No results found.  Assessment and Plan:   Margaret Hayden is a 38 y.o. y/o female    here to see me today as a follow-up to bloating.  My impression on her last visit was a due to small intestinal bacterial overgrowth syndrome.  Two-week course of Xifaxan has significantly improved her symptoms but not completely.  I feel she may have some residual bacteria which probably needs a week longer course of antibiotics.  Plan  1. Low FODMAP diet  2.   Will provide 1 more week of Xifaxan samples provided.  Dr Wyline MoodKiran Aleksander Edmiston  MD,MRCP [facility](U.K) Follow up in follow-up as needed

## 2019-04-26 ENCOUNTER — Other Ambulatory Visit: Payer: Self-pay

## 2019-04-26 ENCOUNTER — Telehealth: Payer: Self-pay | Admitting: Gastroenterology

## 2019-04-26 DIAGNOSIS — K219 Gastro-esophageal reflux disease without esophagitis: Secondary | ICD-10-CM

## 2019-04-26 MED ORDER — DEXLANSOPRAZOLE 60 MG PO CPDR: 60.0000 mg | DELAYED_RELEASE_CAPSULE | Freq: Every day | ORAL | 3 refills | Status: AC

## 2019-04-26 NOTE — Telephone Encounter (Signed)
Called pt to inform her that we've refilled the [place].  Unable to contact, left detailed VM

## 2019-04-26 NOTE — Telephone Encounter (Signed)
Pt is calling she needs refill on rx  dexlansoprazole (DEXILANT) 60 MG capsule to [facility] on [address]

## 2019-07-28 ENCOUNTER — Other Ambulatory Visit: Payer: Self-pay | Admitting: Gastroenterology

## 2019-07-28 DIAGNOSIS — K219 Gastro-esophageal reflux disease without esophagitis: Secondary | ICD-10-CM

## 2020-01-24 ENCOUNTER — Telehealth: Payer: Self-pay | Admitting: *Deleted

## 2020-01-24 NOTE — Telephone Encounter (Signed)
Email medical release.
